# Patient Record
Sex: Female | Born: 1935 | Race: White | Hispanic: No | Smoking: Current every day smoker
Health system: Southern US, Community
[De-identification: ages and names within clinical notes are randomized; demographics above are authoritative.]

## PROBLEM LIST (undated history)

## (undated) DIAGNOSIS — R1310 Dysphagia, unspecified: Secondary | ICD-10-CM

## (undated) DIAGNOSIS — F0390 Unspecified dementia without behavioral disturbance: Secondary | ICD-10-CM

## (undated) DIAGNOSIS — F419 Anxiety disorder, unspecified: Secondary | ICD-10-CM

## (undated) DIAGNOSIS — T4145XA Adverse effect of unspecified anesthetic, initial encounter: Secondary | ICD-10-CM

## (undated) DIAGNOSIS — H9190 Unspecified hearing loss, unspecified ear: Secondary | ICD-10-CM

## (undated) DIAGNOSIS — F329 Major depressive disorder, single episode, unspecified: Secondary | ICD-10-CM

## (undated) DIAGNOSIS — K649 Unspecified hemorrhoids: Secondary | ICD-10-CM

## (undated) DIAGNOSIS — C519 Malignant neoplasm of vulva, unspecified: Secondary | ICD-10-CM

## (undated) DIAGNOSIS — T8859XA Other complications of anesthesia, initial encounter: Secondary | ICD-10-CM

## (undated) DIAGNOSIS — I10 Essential (primary) hypertension: Secondary | ICD-10-CM

## (undated) DIAGNOSIS — B059 Measles without complication: Secondary | ICD-10-CM

## (undated) DIAGNOSIS — N390 Urinary tract infection, site not specified: Secondary | ICD-10-CM

## (undated) DIAGNOSIS — D649 Anemia, unspecified: Secondary | ICD-10-CM

## (undated) DIAGNOSIS — R41841 Cognitive communication deficit: Secondary | ICD-10-CM

## (undated) DIAGNOSIS — G894 Chronic pain syndrome: Secondary | ICD-10-CM

## (undated) DIAGNOSIS — H269 Unspecified cataract: Secondary | ICD-10-CM

## (undated) DIAGNOSIS — H543 Unqualified visual loss, both eyes: Secondary | ICD-10-CM

## (undated) HISTORY — DX: Unspecified dementia, unspecified severity, without behavioral disturbance, psychotic disturbance, mood disturbance, and anxiety: F03.90

## (undated) HISTORY — PX: CATARACT EXTRACTION: SUR2

## (undated) HISTORY — DX: Urinary tract infection, site not specified: N39.0

## (undated) HISTORY — DX: Malignant neoplasm of vulva, unspecified: C51.9

## (undated) HISTORY — DX: Unspecified hearing loss, unspecified ear: H91.90

## (undated) HISTORY — PX: TUBAL LIGATION: SHX77

## (undated) HISTORY — PX: EYE SURGERY: SHX253

## (undated) HISTORY — PX: APPENDECTOMY: SHX54

## (undated) HISTORY — DX: Unqualified visual loss, both eyes: H54.3

## (undated) HISTORY — DX: Measles without complication: B05.9

---

## 2013-02-27 DIAGNOSIS — R030 Elevated blood-pressure reading, without diagnosis of hypertension: Secondary | ICD-10-CM

## 2014-02-03 ENCOUNTER — Encounter (HOSPITAL_COMMUNITY): Payer: Self-pay | Admitting: Emergency Medicine

## 2014-02-03 ENCOUNTER — Emergency Department (HOSPITAL_COMMUNITY)
Admission: EM | Admit: 2014-02-03 | Discharge: 2014-02-03 | Disposition: A | Discharge status: Home / Self Care | Payer: Medicare Other | Attending: Emergency Medicine | Admitting: Emergency Medicine

## 2014-02-03 DIAGNOSIS — N949 Unspecified condition associated with female genital organs and menstrual cycle: Secondary | ICD-10-CM | POA: Diagnosis present

## 2014-02-03 DIAGNOSIS — I10 Essential (primary) hypertension: Secondary | ICD-10-CM | POA: Insufficient documentation

## 2014-02-03 DIAGNOSIS — F17200 Nicotine dependence, unspecified, uncomplicated: Secondary | ICD-10-CM | POA: Insufficient documentation

## 2014-02-03 DIAGNOSIS — N390 Urinary tract infection, site not specified: Secondary | ICD-10-CM | POA: Diagnosis not present

## 2014-02-03 DIAGNOSIS — Z792 Long term (current) use of antibiotics: Secondary | ICD-10-CM | POA: Diagnosis not present

## 2014-02-03 DIAGNOSIS — N898 Other specified noninflammatory disorders of vagina: Secondary | ICD-10-CM | POA: Diagnosis not present

## 2014-02-03 HISTORY — DX: Essential (primary) hypertension: I10

## 2014-02-03 LAB — URINE MICROSCOPIC-ADD ON

## 2014-02-03 LAB — URINALYSIS, ROUTINE W REFLEX MICROSCOPIC
Bilirubin Urine: NEGATIVE
Glucose, UA: NEGATIVE mg/dL
Ketones, ur: NEGATIVE mg/dL
Nitrite: NEGATIVE
Protein, ur: NEGATIVE mg/dL
UROBILINOGEN UA: 0.2 mg/dL (ref 0.0–1.0)
pH: 6.5 (ref 5.0–8.0)

## 2014-02-03 MED ORDER — CIPROFLOXACIN HCL 500 MG PO TABS: 500.0000 mg | ORAL_TABLET | Freq: Two times a day (BID) | ORAL | Status: DC

## 2014-02-03 NOTE — ED Notes (Signed)
Family reports pt has had burning with urination for the past 6 months.  Reports has had several visits to her PCP for same.  Today pt reports also has bumps on or around her vagina and burning is more severe when she voids.

## 2014-02-03 NOTE — ED Provider Notes (Signed)
CSN: [id number]     Arrival date & time 02/03/14  1105 History  This chart was scribed for Nat Christen, MD by Ludger Nutting, ED Scribe. This patient was seen in room APA07/APA07 and the patient's care was started 12:11 PM.    No chief complaint on file.   The history is provided by the patient. No language interpreter was used.    HPI Comments: Erin Mckay is a 80 y.o. female who presents to the Emergency Department complaining of constant, gradually worsening vulvar pain that has been ongoing for the past 6 months. Patient describes the pain as a burning sensation and states it is worse with urination. She also reports associated bumps near the entrance of the vagina. Patient states she was seen by Dr. Melina Copa 3 months ago for the same symptoms but was not treated.  She has no other complaints at this time.   PCP Melina Copa in [place]   Past Medical History  Diagnosis Date  . Hypertension    History reviewed. No pertinent past surgical history. No family history on file. History  Substance Use Topics  . Smoking status: Current Every Day Smoker  . Smokeless tobacco: Not on file  . Alcohol Use: No   OB History   Grav Para Term Preterm Abortions TAB SAB Ect Mult Living                 Review of Systems  A complete 10 system review of systems was obtained and all systems are negative except as noted in the HPI and PMH.    Allergies  Streptomycin  Home Medications   Prior to Admission medications   Medication Sig Start Date End Date Taking? Authorizing Provider  lisinopril (PRINIVIL,ZESTRIL) 40 MG tablet Take 1 tablet by mouth every evening.  11/14/13  Yes Historical Provider, MD  ciprofloxacin (CIPRO) 500 MG tablet Take 1 tablet (500 mg total) by mouth 2 (two) times daily. One po bid x 7 days 02/03/14   Nat Christen, MD   BP 190/74  Pulse 80  Temp(Src) 97.9 F (36.6 C) (Oral)  Resp 18  SpO2 99% Physical Exam  Nursing note and vitals reviewed. Constitutional: She is  oriented to person, place, and time. She appears well-developed and well-nourished.  HENT:  Head: Normocephalic and atraumatic.  Eyes: Conjunctivae and EOM are normal. Pupils are equal, round, and reactive to light.  Neck: Normal range of motion. Neck supple.  Cardiovascular: Normal rate, regular rhythm and normal heart sounds.   Pulmonary/Chest: Effort normal and breath sounds normal.  Abdominal: Soft. Bowel sounds are normal.  Genitourinary:  1.5 cm diameter papular, erythematous area near the right introitus.   Musculoskeletal: Normal range of motion.  Neurological: She is alert and oriented to person, place, and time.  Skin: Skin is warm and dry.  Psychiatric: She has a normal mood and affect. Her behavior is normal.    ED Course  Procedures (including critical care time)  DIAGNOSTIC STUDIES: Oxygen Saturation is 97% on RA, adequate by my interpretation.    COORDINATION OF CARE: 12:26 PM Will have patient follow up with a gynecologist. Discussed treatment plan with pt at bedside and pt agreed to plan.   Labs Review Labs Reviewed  URINALYSIS, ROUTINE W REFLEX MICROSCOPIC - Abnormal; Notable for the following:    Specific Gravity, Urine <1.005 (*)    Hgb urine dipstick TRACE (*)    Leukocytes, UA LARGE (*)    All other components within normal limits  URINE CULTURE  URINE MICROSCOPIC-ADD ON    Imaging Review No results found.   EKG Interpretation None      MDM   Final diagnoses:  Vaginal lesion  UTI (lower urinary tract infection)    Physical exam reveals a lesion in the right introitus. Will refer patient to Dr. Mallory Shirk. Also Rx for urinary tract infection with Cipro 500 mg twice a day. Discussed with patient and her daughter  I personally performed the services described in this documentation, which was scribed in my presence. The recorded information has been reviewed and is accurate.   Nat Christen, MD 02/03/14 [phone]

## 2014-02-03 NOTE — Discharge Instructions (Signed)
I left a message for gynecologist on call, Dr. Mallory Shirk.   If you do not get a followup call by Monday afternoon, call his office.  Antibiotic for urinary infection.

## 2014-02-04 LAB — URINE CULTURE
COLONY COUNT: NO GROWTH
Culture: NO GROWTH
SPECIAL REQUESTS: NORMAL

## 2014-02-09 ENCOUNTER — Other Ambulatory Visit: Payer: Self-pay | Admitting: Obstetrics and Gynecology

## 2014-02-09 ENCOUNTER — Encounter: Payer: Self-pay | Admitting: Obstetrics and Gynecology

## 2014-02-09 ENCOUNTER — Ambulatory Visit (INDEPENDENT_AMBULATORY_CARE_PROVIDER_SITE_OTHER): Payer: Medicare Other | Admitting: Obstetrics and Gynecology

## 2014-02-09 VITALS — BP 162/78 | Ht 62.0 in | Wt 123.0 lb

## 2014-02-09 DIAGNOSIS — C519 Malignant neoplasm of vulva, unspecified: Secondary | ICD-10-CM | POA: Insufficient documentation

## 2014-02-09 HISTORY — DX: Malignant neoplasm of vulva, unspecified: C51.9

## 2014-02-09 NOTE — Addendum Note (Signed)
Addended by: Farley Ly on: 02/09/2014 04:28 PM   Modules accepted: Orders

## 2014-02-09 NOTE — Progress Notes (Signed)
Patient ID: Erin Mckay, female   DOB: 1923-09-07, 80 y.o.   MRN: [id number]  This chart was scribed by Ludger Nutting, Medical Scribe, for Dr. Mallory Shirk on 02/09/14 at 3:40 PM. This chart was reviewed by Dr. Mallory Shirk for accuracy.   [facility] Visit  Patient name: Erin Mckay MRN [id number]  Date of birth: March 12, 1924  CC & HPI:  Erin Mckay is a 80 y.o. female presenting today for follow up after an ED visit. Patient was seen on 6/19 for vulvar pain which has been constant for the past 6 months. She was diagnosed with a papular lesion to the right introitus and was referred here for follow up. Patient states she has applied Vaseline to the area without relief. She also reports associated itching and burning sensations which is worse with urination.   ROS:  +vulvar pain +vulvar itching   Pertinent History Reviewed:   Reviewed: Significant for  Medical                                    Surgical Hx:    Medications: Reviewed & Updated - see associated section Social History: Reviewed -  reports that she has been smoking Cigarettes.  She has a 24 pack-year smoking history. She has quit using smokeless tobacco. Her smokeless tobacco use included Chew.  Objective Findings:  Vitals: Blood pressure 162/78, height 5\' 2"  (1.575 m), weight 123 lb (55.792 kg).  Physical Examination: General appearance - alert, well appearing, and in no distress and oriented to person, place, and time Pelvic -  VULVA: no adenopathy, 2.5 cm raised, firm, circular mass on inner right labia majora at 9 o'clock VAGINA: not indicated  CERVIX: not indicated UTERUS: not indicated ADNEXA: not indicated  VULVAR BIOPSY NOTE The indications for vulvar biopsy (rule out neoplasia, establish lichen sclerosus diagnosis) were reviewed.   Risks of the biopsy including pain, bleeding, infection, inadequate specimen, scarring and need for additional procedures  were discussed. The patient stated  understanding and agreed to undergo procedure today. Consent was signed,  time out performed.  The patient's vulva was prepped with Betadine. 2% lidocaine with epi was injected into the inner right labia majora. A 5-mm punch biopsy was done, biopsy tissue was picked up with sterile forceps and sterile scissors were used to excise the lesion.  Small bleeding was noted and hemostasis was achieved using silver nitrate sticks.and one stitch of prolene  The patient tolerated the procedure well. Post-procedure instructions  (pelvic rest for one week) were given to the patient. The patient is to call with heavy bleeding, fever greater than 100.4, foul smelling vaginal discharge or other concerns. The patient will be return to clinic in two weeks for discussion of results.   Assessment & Plan:   A: 1. Vulvar cancer /vulvar mass  P: 1. Follow up for biopsy results and suture removal in 12-14 days

## 2014-02-20 ENCOUNTER — Telehealth: Payer: Self-pay | Admitting: *Deleted

## 2014-02-20 NOTE — Telephone Encounter (Signed)
Dr. Glo Herring discussed biopsy results with family .

## 2014-02-23 ENCOUNTER — Ambulatory Visit (INDEPENDENT_AMBULATORY_CARE_PROVIDER_SITE_OTHER): Payer: Medicare Other | Admitting: Obstetrics and Gynecology

## 2014-02-23 ENCOUNTER — Encounter: Payer: Self-pay | Admitting: Obstetrics and Gynecology

## 2014-02-23 VITALS — BP 180/100 | Wt 120.0 lb

## 2014-02-23 DIAGNOSIS — C519 Malignant neoplasm of vulva, unspecified: Secondary | ICD-10-CM

## 2014-02-23 NOTE — Patient Instructions (Addendum)
Follow up scheduled appointment with Gyn oncologist at [facility], at [address] , [place]  On July 13. Phone [phone] 11:30 am. Dr Denman George.

## 2014-02-23 NOTE — Progress Notes (Signed)
This chart was scribed by Ludger Nutting, Medical Scribe, for Dr. Mallory Shirk on 02/23/14 at 1:55 PM. This chart was reviewed by Dr. Mallory Shirk for accuracy.   [facility] Visit  Patient name: Erin Mckay MRN [id number]  Date of birth: 12-Apr-1924  CC & HPI:  Erin Mckay is a 80 y.o. female presenting today for a vulvar stitch removal. Patient was seen on 02/09/14 for a vulvar biopsy which required a stitch placement. She continues to have vulvar pain which is worse after urination. She describes the pain as a burning sensation. She has been applying Vaseline to the affected area even though she was advised to apply Desitin.   ROS:  Negative except for noted in the HPI  Pertinent History Reviewed:   Reviewed: Significant for  Medical                                    Surgical Hx:    Medications: Reviewed & Updated - see associated section Social History: Reviewed -  reports that she has been smoking Cigarettes.  She has a 24 pack-year smoking history. She has quit using smokeless tobacco. Her smokeless tobacco use included Chew.  Objective Findings:  Vitals: Blood pressure 180/100, weight 120 lb (54.432 kg).  Physical Examination: General appearance - alert, well appearing, and in no distress and oriented to person, place, and time Mental status - alert, oriented to person, place, and time, normal mood, behavior, speech, dress, motor activity, and thought processes Pelvic -  VULVA: Kissing lesion, redness bilaterally WHICH IS DRAMATICALLY ERYTHEMATOUS THAN LAST WEEK,  VAGINA: normal appearing vagina with normal color and discharge   Assessment & Plan:   A: 1. Stitch removal, Biopsy stitch healing adequately, stitch removed. 2. Kissing lesion, redness bilaterally WHICH IS DRAMATICALLY more ERYTHEMATOUS THAN LAST WEEK  P: 1. Discontinue use of Vaseline 2. Dry vulva regimen with blow dryer on cool after bath, tissue tucked in.     desitin prn voiding. 2. Follow up  scheduled appointment with [facility] oncologist at [facility], at [address] , [place], [place]

## 2014-02-27 ENCOUNTER — Encounter: Payer: Self-pay | Admitting: Gynecologic Oncology

## 2014-02-27 ENCOUNTER — Ambulatory Visit: Payer: Medicare Other | Attending: Gynecologic Oncology | Admitting: Gynecologic Oncology

## 2014-02-27 VITALS — BP 196/89 | HR 77 | Temp 97.8°F | Resp 16 | Ht 60.95 in | Wt 121.4 lb

## 2014-02-27 DIAGNOSIS — F17200 Nicotine dependence, unspecified, uncomplicated: Secondary | ICD-10-CM | POA: Insufficient documentation

## 2014-02-27 DIAGNOSIS — I10 Essential (primary) hypertension: Secondary | ICD-10-CM | POA: Insufficient documentation

## 2014-02-27 DIAGNOSIS — C519 Malignant neoplasm of vulva, unspecified: Secondary | ICD-10-CM | POA: Diagnosis not present

## 2014-02-27 DIAGNOSIS — L293 Anogenital pruritus, unspecified: Secondary | ICD-10-CM

## 2014-02-27 DIAGNOSIS — Z79899 Other long term (current) drug therapy: Secondary | ICD-10-CM | POA: Diagnosis not present

## 2014-02-27 DIAGNOSIS — N94819 Vulvodynia, unspecified: Secondary | ICD-10-CM

## 2014-02-27 MED ORDER — LIDOCAINE 5 % EX OINT: 1.0000 "application " | TOPICAL_OINTMENT | Freq: Two times a day (BID) | CUTANEOUS | Status: DC | PRN

## 2014-02-27 NOTE — Patient Instructions (Signed)
Vulvectomy, Care After The vulva is the external female genitalia, outside and around the vagina and pubic bone. It consists of:  The skin on, and in front of, the pubic bone.  The clitoris.  The labia majora (large lips) on the outside of the vagina.  The labia minora (small lips) around the opening of the vagina.  The opening and the skin in and around the vagina. A vulvectomy is the removal of the tissue of the vulva, which sometimes includes removal of the lymph nodes and tissue in the groin areas. These discharge instructions provide you with general information on caring for yourself after you leave the hospital. It is also important that you know the warning signs of complications, so that you can seek treatment. Please read the instructions outlined below and refer to this sheet in the next few weeks. Your caregiver may also give you specific information and medicines. If you have any questions or complications after discharge, please call your caregiver. ACTIVITY  Rest as much as possible the first two weeks after discharge.  Arrange to have help from family or others with your daily activities when you go home.  Avoid heavy lifting (more than 5 pounds), pushing, or pulling.  If you feel tired, balance your activity with rest periods.  Follow your caregiver's instruction about climbing stairs and driving a car.  Increase activity gradually.  Do not exercise until you have permission from your caregiver. LEG AND FOOT CARE If your doctor has removed lymph nodes from your groin area, there may be an increase in swelling of your legs and feet. You can help prevent swelling by doing the following:  Elevate your legs while sitting or lying down.  If your caregiver has ordered special stockings, wear them according to instructions.  Avoid standing in one place for long periods of time.  Call the physical therapy department if you have any questions about swelling or treatment  for swelling.  Avoid salt in your diet. It can cause fluid retention and swelling.  Do not cross your legs, especially when sitting. NUTRITION  You may resume your normal diet.  Drink 6 to 8 glasses of fluids a day.  Eat a healthy, balanced diet including portions of food from the meat (protein), milk, fruit, vegetable, and bread groups.  Your caregiver may recommend you take a multivitamin with iron. ELIMINATION  You may notice that your stream of urine is at a different angle, and may tend to spray. Using a plastic funnel may help to decrease urine spray.  If constipation occurs, drink more liquids, and add more fruits, vegetables, and bran to your diet. You may take a mild laxative, such as Milk of Magnesia, Metamucil or a stool softener, such as Colace with permission from your caregiver. HYGIENE  You may shower and wash your hair.  Check with your caregiver about tub baths.  Do not add any bath oils or chemicals to your bath water, after you have permission to take baths.  Clean yourself well after moving your bowels.  After urinating, wipe from top to bottom.  A sitz bath will help keep your perineal area clean, reduce swelling, and provide comfort. HOME CARE INSTRUCTIONS   Take your temperature twice a day and record it, especially if you feel feverish or have chills.  Follow your caregiver's instructions about medicines, activity, and follow-up appointments after surgery.  Do not drink alcohol while taking pain medicine.  Change your dressing as advised by your caregiver.  You may take over-the-counter medicine for pain, recommended by your caregiver.  If your pain is not relieved with medicine, call your caregiver.  Do not take aspirin, because it can cause bleeding.  Do not douche or use tampons (use a non-perfumed sanitary pad).  Do not have sexual intercourse until your caregiver gives you permission. Hugging, kissing, and playful sexual activity is  fine with your caregiver's permission.  Warm sitz baths, with your caregiver's permission, are helpful to control swelling and discomfort.  Take showers instead of baths, until your caregiver gives you permission to take baths.  You may take a mild medicine for constipation, recommended by your caregiver. Bran foods and drinking a lot of fluids will help with constipation.  Make sure your family understands everything about your operation and recovery. SEEK MEDICAL CARE IF:   You notice swelling and redness around the wound area.  You notice a foul smell coming from the wound or on the surgical dressing.  You notice the wound is separating.  You have painful or bloody urination.  You develop nausea and vomiting.  You develop diarrhea.  You develop a rash.  You have a reaction or allergy from the medicine.  You feel dizzy or light headed.  You need stronger pain medicine. SEEK IMMEDIATE MEDICAL CARE IF:   You develop a temperature of 102 F (38.9 C) or higher.  You pass out.  You develop leg or chest pain.  You develop abdominal pain.  You develop shortness of breath.  You develop bleeding from the wound area.  You see pus in the wound area. MAKE SURE YOU:   Understand these instructions.  Will watch your condition.  Will get help right away if you are not doing well or get worse. Document Released: 03/18/2004 Document Revised: 05/25/2013 Document Reviewed: 07/06/2009 [facility] Patient Information 2015 [place], [place]. This information is not intended to replace advice given to you by your health care provider. Make sure you discuss any questions you have with your health care provider.

## 2014-02-27 NOTE — Progress Notes (Signed)
Consult Note: Gyn-Onc  Consult was requested by Dr. Glo Herring for the evaluation of Erin Mckay 80 y.o. female for clinical stage IB vulvar cancer (SCC).  CC:  Chief Complaint  Patient presents with  . Vulvar Cancer  . Vaginal Itching    Assessment/Plan:  Ms. Erin Mckay  is a 80 y.o.  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 Ms. Erin Mckay main symptom is of vulvodynia including pruritus and discomfort. She is attended today by her daughters Velva Harman and Shauna Hugh. All 3 field very strongly that they do not want surgical intervention. There determined to receive palliative treatment only. She presents today with poorly controlled hypertension.  I discussed the ideal management of a clinical stage IB vulvar cancer would be a modified radical left vulvectomy with left inguinal lymphadenectomy versus sentinel lymph node biopsy. Preoperatively I would ascertain whether or not there was gross metastatic disease by performing a PET scan. However I agree that in this patient the likely morbidity of such a procedure would outweigh its benefits. An alternative to this radical procedure would be a palliative toileting procedure, which would include a wide local excision of the primary tumor with a goal to achieve minimal margins, but excised for symptomatic to my prior to it encroaching upon vital perineal structures. I discussed that this procedure, a wide local excision of the right vulva, can usually be achieved under either her spinal or local anesthesia, may be able to be performed as an outpatient or outpatient in bed procedure, it is associated with a low morbidity. I feel that this procedure carries the highest likelihood of controlling symptoms of pruritus and discomfort both in the short-term and long-term while sparing her a radical surgical excision. I discussed the only nonsurgical treatment for a vulvar  cancer which is primary radiation, and we talked about indications for primary radiation which predominantly include patients with an acceptable surgical risk, all patients whose vulva cancer encroaches upon vital perineal structures such as the urethra or anus. I discussed that primary radiation is associated with significant perineal burning, and symptoms. As such I do not incudis the most ideal procedure for this patient whose primary motive used to minimize morbidity in symptoms.  I discussed that prior to embarking upon any procedure even a minor wide local excision, Erin Mckay would require optimization of her blood pressure. Am recommending that she return to see her primary care doctor Dr. Octavio Graves. It may be that she requires increase dosing of her lisinopril or the addition of a second medication. I believe her blood pressure at present places her at at substantial risk for a a cardiac or cerebrovascular event perioperatively.  Erin Mckay and her daughters expressed ongoing concerns regarding any surgical intervention including a minor wide local excision. I emphasized that I felt that the window of the possibility for this procedure was narrow, as the lesion will continue to grow, in which case a relatively small excision will not be possible. I discussed anticipated postoperative recovery from a wide local excision and perineal care. Provided the patient with this information and for discharge from this visit.  To palliate her current symptoms of vulvar pruritis, I prescribed 4% topical lidocaine ointment to apply the right ear when necessary underneath thickly apply Desitin. I am recommending we application of Desitin and after each episode of toileting. Erin Mckay and her daughters will reach out to me in my office if they decide to proceed  with a wide local excision of the vulva cancer or have additional questions. I provided them with my contact information.   HPI: Erin Mckay is an 80 year old  woman who is seen in consultation at the request of Dr. Glo Herring for clinical stage IB squamous cell carcinoma of the right vulva  (moderately differentiated). Erin Mckay has a 5 year history of vulva itch. She reports feeling a bump in the area for approximately one month she was evaluated by Dr. Glo Herring on 02/09/2014 at which time he identified a right-sided 2.5 cm raised lesion on the mid labia minora at 9:00. He performed a 5 mm punch biopsy of that lesions and pathology returned moderately differentiated squamous cell carcinoma. Since that time she is reported increasing vulvar pruritus. She's been using Vaseline cream initially, but in the past 48 hours has switched to Desitin and since that time has noticed improvement in symptoms.   She is a very independent elderly woman who is determined to continue independent functioning and strongly desires nonsurgical management of her condition.  Interval History: Improvement in symptoms with Desitin use.  Current Meds:  Outpatient Encounter Prescriptions as of 02/27/2014  Medication Sig  . Aspirin-Salicylamide-Caffeine (BC HEADACHE POWDER PO) Take by mouth.  . ciprofloxacin (CIPRO) 500 MG tablet Take 1 tablet (500 mg total) by mouth 2 (two) times daily. One po bid x 7 days  . lidocaine (XYLOCAINE) 5 % ointment Apply 1 application topically 2 (two) times daily as needed.  Marland Kitchen lisinopril (PRINIVIL,ZESTRIL) 40 MG tablet Take 1 tablet by mouth every evening.     Allergy:  Allergies  Allergen Reactions  . Streptomycin     Any mycin drugs    Social Hx:   History   Social History  . Marital Status: Widowed    Spouse Name: N/A    Number of Children: N/A  . Years of Education: N/A   Occupational History  . Not on file.   Social History Main Topics  . Smoking status: Current Every Day Smoker -- 0.50 packs/day for 48 years    Types: Cigarettes  . Smokeless tobacco: Former Systems developer    Types: Chew  . Alcohol Use: No  . Drug Use: No  . Sexual  Activity: Not Currently    Birth Control/ Protection: Post-menopausal   Other Topics Concern  . Not on file   Social History Narrative  . No narrative on file    Past Surgical Hx:  Past Surgical History  Procedure Laterality Date  . Tubal ligation    . Appendectomy    . Eye surgery Left     cataract removal and was blinded.    Past Medical Hx:  Past Medical History  Diagnosis Date  . Hypertension   . Frequent UTI   . Hard of hearing   . Vision loss, bilateral   . Measles   . Vulvar cancer 02/09/14    Past Gynecological History:  5 vaginal deliveries. No history of vulvar dysplasia, no history of HPV disease, no history of urinary incontinence. No history of lichen sclerosis. No LMP recorded. Patient is postmenopausal.  Family Hx:  Family History  Problem Relation Age of Onset  . Heart disease Mother   . Early death Father   . Anuerysm Sister     stomach  . Cancer Brother   . Hypertension Daughter   . Hyperlipidemia Daughter   . Cancer Son     bladder  . Heart disease Brother   . Hypertension Son   .  Hypertension Son   . Hypertension Daughter     Review of Systems:  Constitutional  Feels well,  with the exception of history present complaint.  ENT Normal appearing ears and nares bilaterally Skin/Breast  No rash, sores, jaundice, itching, dryness Cardiovascular  No chest pain, shortness of breath, or edema  Pulmonary  No cough or wheeze.  Gastro Intestinal  No nausea, vomitting, or diarrhoea. No bright red blood per rectum, no abdominal pain, change in bowel movement, or constipation.  Genito Urinary  No frequency, urgency, dysuria, see history of present illness Musculo Skeletal  No myalgia, arthralgia, joint swelling or pain  Neurologic  No weakness, numbness, change in gait,  Psychology  No depression, anxiety, insomnia.   Vitals:  Blood pressure 196/89, pulse 77, temperature 97.8 F (36.6 C), temperature source Oral, resp. rate 16, height 5'  0.95" (1.548 m), weight 121 lb 6.4 oz (55.067 kg).  Physical Exam: WD in NAD Neck  Supple NROM, without any enlargements.  Lymph Node Survey No cervical supraclavicular or inguinal adenopathy. Cardiovascular  Pulse normal rate, regularity and rhythm. S1 and S2 normal.  Lungs  Clear to auscultation bilateraly, without wheezes/crackles/rhonchi. Good air movement.  Skin  No rash/lesions/breakdown  Psychiatry  Alert and oriented to person, place, and time  Abdomen  Normoactive bowel sounds, abdomen soft, non-tender and non-obese. Presence of a left inguinal reducable soft hernia.   Back No CVA tenderness Genito Urinary  Vulva/vagina:  A 2.5 cm, raised, erythematous, nodular lesion, which is discoid in shape, is identified in the mid right labia minora. This 2 cm from the urethral meatus and midline anteriorly. It is greater than 5 cm from the midline perineal structures posteriorly and anus. Is tender to palpation. The surrounding skin of the labia majora and minora it is bright red and irritated and somewhat desquamated.   Bladder/urethra:  No lesions or masses, well supported bladder  Vagina: Deferred  Cervix: deferred exam  Uterus: deferred exam  Adnexa: deferred exam  Rectal : Deferred exam Extremities  No bilateral cyanosis, clubbing or edema.   @[facility] @ 02/27/2014, 12:39 PM

## 2014-03-02 ENCOUNTER — Telehealth: Payer: Self-pay | Admitting: Obstetrics and Gynecology

## 2014-03-02 NOTE — Telephone Encounter (Signed)
I spoke with the patient and her daughter , and encouraged them to strongly consider wide local excision as recommended by Dr Denman George.  Family to discuss and call dr Denman George with decision

## 2014-03-02 NOTE — Telephone Encounter (Signed)
Message copied by Jonnie Kind on Thu Mar 02, 2014  7:02 PM ------      Message from: Everitt Amber C      Created: Mon Feb 27, 2014 12:42 PM       Dr Glo Herring,      Thank you for this consultation of Ms Stansel. She is motivated for a minimal (or no) procedure. She is only interested in symptom palliation. I believe the best option for her her is wide local excision/partial simple vulvectomy under spinal or local and recommend doing this quickly while the lesion is small. She would require optimization of her BP with Dr Melina Copa prior to this. The patient and her daughters will further consider. I prescribed topical lidocaine ointment to help in the short term with pruritis (in addition to Desitin). ------

## 2014-03-07 ENCOUNTER — Telehealth: Payer: Self-pay | Admitting: *Deleted

## 2014-03-07 NOTE — Telephone Encounter (Signed)
Pt's daughter called to change surgery date. Date changed to Aug 11, [facility] OR. Case reposted

## 2014-03-15 ENCOUNTER — Telehealth: Payer: Self-pay | Admitting: *Deleted

## 2014-03-15 NOTE — Telephone Encounter (Signed)
Pt's daughter called states "Erin Mckay needs to see Dr. Denman George before her surgery on 8/11, she's having burning and itching from that place that they did a biopsy on in [place], it never really healed. We've been putting the cream on the area and keeping it dry but it is not helping."  Discussed with daughter I wil review with Dr. Denman George her concerns and call back with further instructions.

## 2014-03-17 ENCOUNTER — Encounter (HOSPITAL_COMMUNITY): Payer: Self-pay | Admitting: Pharmacy Technician

## 2014-03-17 ENCOUNTER — Ambulatory Visit (INDEPENDENT_AMBULATORY_CARE_PROVIDER_SITE_OTHER): Payer: Medicare Other | Admitting: Obstetrics and Gynecology

## 2014-03-17 ENCOUNTER — Encounter: Payer: Self-pay | Admitting: Obstetrics and Gynecology

## 2014-03-17 VITALS — BP 160/80 | Ht 62.0 in | Wt 121.0 lb

## 2014-03-17 DIAGNOSIS — R300 Dysuria: Secondary | ICD-10-CM

## 2014-03-17 LAB — POCT URINALYSIS DIPSTICK
GLUCOSE UA: NEGATIVE
Ketones, UA: NEGATIVE
NITRITE UA: NEGATIVE
PROTEIN UA: NEGATIVE

## 2014-03-17 NOTE — Telephone Encounter (Signed)
Pt followed up with GYN for symptoms

## 2014-03-17 NOTE — Progress Notes (Signed)
Patient ID: Erin Mckay, female   DOB: June 19, 1924, 80 y.o.   MRN: [id number]   [facility] Visit  Patient name: Erin Mckay MRN [id number]  Date of birth: 10-26-1923  CC & HPI:  [name] is a 80 y.o. female presenting today for r/o uti   ROS:  Pt scheduled for preop visit next week for partial vulvectomy on 11 August  Pertinent History Reviewed:   Reviewed: Significant for chronic irritation in vulvar area Medical         Past Medical History  Diagnosis Date  . Hypertension   . Frequent UTI   . Hard of hearing   . Vision loss, bilateral   . Measles   . Vulvar cancer 02/09/14                              Surgical Hx:    Past Surgical History  Procedure Laterality Date  . Tubal ligation    . Appendectomy    . Eye surgery Left     cataract removal and was blinded.   Medications: Reviewed & Updated - see associated section                      Current outpatient prescriptions:Aspirin-Salicylamide-Caffeine (BC HEADACHE POWDER PO), Take 1 application by mouth every 6 (six) hours as needed (pain). , Disp: , Rfl: ;  lisinopril (PRINIVIL,ZESTRIL) 40 MG tablet, Take 40 mg by mouth at bedtime. , Disp: , Rfl: ;  amLODipine (NORVASC) 5 MG tablet, Take 5 mg by mouth every evening., Disp: , Rfl:    Social History: Reviewed -  reports that she has been smoking Cigarettes.  She has a 24 pack-year smoking history. She has quit using smokeless tobacco. Her smokeless tobacco use included Chew.  Objective Findings:  Vitals: Blood pressure 160/80, height 5\' 2"  (1.575 m), weight 121 lb (54.885 kg).  Physical Examination: General appearance - alert, well appearing, and in no distress and appropriate for age Abdomen - soft, nontender, nondistended, no masses or organomegaly Pelvic - VULVA: irritated, erythematous.  IN and out cath done.  Assessment & Plan:   A: vulvar irritation, chronic      Doubt uti 1. F/u catheterization u/a , C&S  P:  1. Continue desitin,

## 2014-03-17 NOTE — Patient Instructions (Addendum)
Continue desitin as needed Expect a call on the urine culture.  You may call next Wednesday for results.

## 2014-03-19 LAB — URINE CULTURE
Colony Count: NO GROWTH
ORGANISM ID, BACTERIA: NO GROWTH

## 2014-03-20 ENCOUNTER — Ambulatory Visit (HOSPITAL_BASED_OUTPATIENT_CLINIC_OR_DEPARTMENT_OTHER): Admission: RE | Admit: 2014-03-20 | Payer: Medicare Other | Source: Ambulatory Visit | Admitting: Gynecologic Oncology

## 2014-03-20 ENCOUNTER — Encounter (HOSPITAL_BASED_OUTPATIENT_CLINIC_OR_DEPARTMENT_OTHER): Admission: RE | Payer: Self-pay | Source: Ambulatory Visit

## 2014-03-20 ENCOUNTER — Encounter (HOSPITAL_COMMUNITY)
Admission: RE | Admit: 2014-03-20 | Discharge: 2014-03-20 | Disposition: A | Discharge status: Home / Self Care | Payer: Medicare Other | Source: Ambulatory Visit | Attending: Gynecologic Oncology | Admitting: Gynecologic Oncology

## 2014-03-20 ENCOUNTER — Encounter (HOSPITAL_COMMUNITY): Payer: Self-pay

## 2014-03-20 ENCOUNTER — Ambulatory Visit (HOSPITAL_COMMUNITY)
Admission: RE | Admit: 2014-03-20 | Discharge: 2014-03-20 | Disposition: A | Discharge status: Home / Self Care | Payer: Medicare Other | Source: Ambulatory Visit | Attending: Gynecologic Oncology | Admitting: Gynecologic Oncology

## 2014-03-20 DIAGNOSIS — C519 Malignant neoplasm of vulva, unspecified: Secondary | ICD-10-CM | POA: Insufficient documentation

## 2014-03-20 DIAGNOSIS — Z01818 Encounter for other preprocedural examination: Secondary | ICD-10-CM | POA: Diagnosis present

## 2014-03-20 HISTORY — DX: Unspecified cataract: H26.9

## 2014-03-20 HISTORY — DX: Unspecified hemorrhoids: K64.9

## 2014-03-20 LAB — CBC
HEMATOCRIT: 40.8 % (ref 36.0–46.0)
HEMOGLOBIN: 12.9 g/dL (ref 12.0–15.0)
MCH: 28.7 pg (ref 26.0–34.0)
MCHC: 31.6 g/dL (ref 30.0–36.0)
MCV: 90.7 fL (ref 78.0–100.0)
Platelets: 274 10*3/uL (ref 150–400)
RBC: 4.5 MIL/uL (ref 3.87–5.11)
RDW: 13.2 % (ref 11.5–15.5)
WBC: 7.9 10*3/uL (ref 4.0–10.5)

## 2014-03-20 LAB — BASIC METABOLIC PANEL
Anion gap: 9 (ref 5–15)
BUN: 14 mg/dL (ref 6–23)
CO2: 28 meq/L (ref 19–32)
CREATININE: 0.88 mg/dL (ref 0.50–1.10)
Calcium: 9.9 mg/dL (ref 8.4–10.5)
Chloride: 104 mEq/L (ref 96–112)
GFR calc Af Amer: 66 mL/min — ABNORMAL LOW (ref >90)
GFR calc non Af Amer: 57 mL/min — ABNORMAL LOW (ref >90)
GLUCOSE: 134 mg/dL — AB (ref 70–99)
Potassium: 4.6 mEq/L (ref 3.7–5.3)
Sodium: 141 mEq/L (ref 137–147)

## 2014-03-20 SURGERY — EXAM UNDER ANESTHESIA
Anesthesia: Choice | Laterality: Right

## 2014-03-20 NOTE — Patient Instructions (Addendum)
[place]  03/20/2014   Your procedure is scheduled on:   03-28-2014 Tuesday at 0900 AM  Enter through [facility] Entrance and follow signs to [facility]. Arrive at     0700   AM.  Call this number if you have problems the morning of surgery: [phone]  Or Presurgical Testing [phone](Abbegayle Denault) For Living Will and/or Health Care Power Attorney Forms: please provide copy for your medical record,may bring AM of surgery(Forms should be already notarized -we do not provide this service).(03-20-14 No information preferred today).  Remember: Follow any bowel prep instructions per MD office.(Clear liquids x 24 hours before surgery).    Do not eat food:After Midnight.      Take these medicines the morning of surgery with A SIP OF WATER: none   Do not wear jewelry, make-up or nail polish.  Do not wear lotions, powders, or perfumes. You may wear deodorant.  Do not shave 48 hours(2 days) prior to first CHG shower(legs and under arms).(Shaving face and neck okay.)  Do not bring valuables to the hospital.(Hospital is not responsible for lost valuables).  Contacts, dentures or removable bridgework, body piercing, hair pins may not be worn into surgery.  Leave suitcase in the car. After surgery it may be brought to your room.  For patients admitted to the hospital, checkout time is 11:00 AM the day of discharge.(Restricted visitors-Any Persons displaying flu-like symptoms or illness).    Patients discharged the day of surgery will not be allowed to drive home. Must have responsible person with you x 24 hours once discharged.  Name and phone number of your driver: Enid Skeens , daughter [phone]  Special Instructions: CHG(Chlorhedine 4%-"Hibiclens","Betasept","Aplicare") Shower Use Special Wash: see special instructions.(avoid face and genitals)   Please read over the following fact sheets that you were given: Incentive Spirometry Instruction.      Patient  signature_______________________________________________________    [facility] - Preparing for Surgery Before surgery, you can play an important role.  Because skin is not sterile, your skin needs to be as free of germs as possible.  You can reduce the number of germs on your skin by washing with CHG (chlorahexidine gluconate) soap before surgery.  CHG is an antiseptic cleaner which kills germs and bonds with the skin to continue killing germs even after washing. Please DO NOT use if you have an allergy to CHG or antibacterial soaps.  If your skin becomes reddened/irritated stop using the CHG and inform your nurse when you arrive at Short Stay. Do not shave (including legs and underarms) for at least 48 hours prior to the first CHG shower.  You may shave your face/neck. Please follow these instructions carefully:  1.  Shower with CHG Soap the night before surgery and the  morning of Surgery.  2.  If you choose to wash your hair, wash your hair first as usual with your  normal  shampoo.  3.  After you shampoo, rinse your hair and body thoroughly to remove the  shampoo.                           4.  Use CHG as you would any other liquid soap.  You can apply chg directly  to the skin and wash                       Gently with a scrungie or clean washcloth.  5.  Apply the CHG Soap to your body ONLY FROM THE NECK DOWN.   Do not use on face/ open                           Wound or open sores. Avoid contact with eyes, ears mouth and genitals (private parts).                       Wash face,  Genitals (private parts) with your normal soap.             6.  Wash thoroughly, paying special attention to the area where your surgery  will be performed.  7.  Thoroughly rinse your body with warm water from the neck down.  8.  DO NOT shower/wash with your normal soap after using and rinsing off  the CHG Soap.                9.  Pat yourself dry with a clean towel.            10.  Wear clean pajamas.            11.   Place clean sheets on your bed the night of your first shower and do not  sleep with pets. Day of Surgery : Do not apply any lotions/deodorants the morning of surgery.  Please wear clean clothes to the hospital/surgery center.  FAILURE TO FOLLOW THESE INSTRUCTIONS MAY RESULT IN THE CANCELLATION OF YOUR SURGERY PATIENT SIGNATURE_________________________________  NURSE SIGNATURE__________________________________  ________________________________________________________________________   Adam Phenix  An incentive spirometer is a tool that can help keep your lungs clear and active. This tool measures how well you are filling your lungs with each breath. Taking long deep breaths may help reverse or decrease the chance of developing breathing (pulmonary) problems (especially infection) following:  A long period of time when you are unable to move or be active. BEFORE THE PROCEDURE   If the spirometer includes an indicator to show your best effort, your nurse or respiratory therapist will set it to a desired goal.  If possible, sit up straight or lean slightly forward. Try not to slouch.  Hold the incentive spirometer in an upright position. INSTRUCTIONS FOR USE  1. Sit on the edge of your bed if possible, or sit up as far as you can in bed or on a chair. 2. Hold the incentive spirometer in an upright position. 3. Breathe out normally. 4. Place the mouthpiece in your mouth and seal your lips tightly around it. 5. Breathe in slowly and as deeply as possible, raising the piston or the ball toward the top of the column. 6. Hold your breath for 3-5 seconds or for as long as possible. Allow the piston or ball to fall to the bottom of the column. 7. Remove the mouthpiece from your mouth and breathe out normally. 8. Rest for a few seconds and repeat Steps 1 through 7 at least 10 times every 1-2 hours when you are awake. Take your time and take a few normal breaths between deep  breaths. 9. The spirometer may include an indicator to show your best effort. Use the indicator as a goal to work toward during each repetition. 10. After each set of 10 deep breaths, practice coughing to be sure your lungs are clear. If you have an incision (the cut made at the time of surgery), support your incision when coughing by placing  a pillow or rolled up towels firmly against it. Once you are able to get out of bed, walk around indoors and cough well. You may stop using the incentive spirometer when instructed by your caregiver.  RISKS AND COMPLICATIONS  Take your time so you do not get dizzy or light-headed.  If you are in pain, you may need to take or ask for pain medication before doing incentive spirometry. It is harder to take a deep breath if you are having pain. AFTER USE  Rest and breathe slowly and easily.  It can be helpful to keep track of a log of your progress. Your caregiver can provide you with a simple table to help with this. If you are using the spirometer at home, follow these instructions: [place] IF:   You are having difficultly using the spirometer.  You have trouble using the spirometer as often as instructed.  Your pain medication is not giving enough relief while using the spirometer.  You develop fever of 100.5 F (38.1 C) or higher. SEEK IMMEDIATE MEDICAL CARE IF:   You cough up bloody sputum that had not been present before.  You develop fever of 102 F (38.9 C) or greater.  You develop worsening pain at or near the incision site. MAKE SURE YOU:   Understand these instructions.  Will watch your condition.  Will get help right away if you are not doing well or get worse. Document Released: 12/15/2006 Document Revised: 10/27/2011 Document Reviewed: 02/15/2007 ExitCare Patient Information 2014 [employer].   ________________________________________________________________________    CLEAR LIQUID DIET   Foods Allowed                                                                      Foods Excluded  Coffee and tea, regular and decaf                             liquids that you cannot  Plain Jell-O in any flavor                                             see through such as: Fruit ices (not with fruit pulp)                                     milk, soups, orange juice  Iced Popsicles                                    All solid food Carbonated beverages, regular and diet                                    Cranberry, grape and apple juices Sports drinks like Gatorade Lightly seasoned clear broth or consume(fat free) Sugar, honey syrup  Sample Menu Breakfast  Lunch                                     Supper Cranberry juice                    Beef broth                            Chicken broth Jell-O                                     Grape juice                           Apple juice Coffee or tea                        Jell-O                                      Popsicle                                                Coffee or tea                        Coffee or tea  _____________________________________________________________________

## 2014-03-20 NOTE — Pre-Procedure Instructions (Signed)
03-20-14 EKG, CXR done today.

## 2014-03-28 ENCOUNTER — Encounter (HOSPITAL_COMMUNITY): Payer: Medicare Other | Admitting: Registered Nurse

## 2014-03-28 ENCOUNTER — Ambulatory Visit (HOSPITAL_COMMUNITY)
Admission: RE | Admit: 2014-03-28 | Discharge: 2014-03-28 | Disposition: A | Discharge status: Home / Self Care | Payer: Medicare Other | Source: Ambulatory Visit | Attending: Gynecologic Oncology | Admitting: Gynecologic Oncology

## 2014-03-28 ENCOUNTER — Encounter (HOSPITAL_COMMUNITY): Payer: Self-pay | Admitting: *Deleted

## 2014-03-28 ENCOUNTER — Encounter (HOSPITAL_COMMUNITY)
Admission: RE | Disposition: A | Discharge status: Home / Self Care | Payer: Self-pay | Source: Ambulatory Visit | Attending: Gynecologic Oncology

## 2014-03-28 ENCOUNTER — Inpatient Hospital Stay (HOSPITAL_COMMUNITY): Payer: Medicare Other | Admitting: Registered Nurse

## 2014-03-28 DIAGNOSIS — C519 Malignant neoplasm of vulva, unspecified: Secondary | ICD-10-CM | POA: Diagnosis present

## 2014-03-28 DIAGNOSIS — Z7982 Long term (current) use of aspirin: Secondary | ICD-10-CM | POA: Insufficient documentation

## 2014-03-28 DIAGNOSIS — Z79899 Other long term (current) drug therapy: Secondary | ICD-10-CM | POA: Diagnosis not present

## 2014-03-28 DIAGNOSIS — F17200 Nicotine dependence, unspecified, uncomplicated: Secondary | ICD-10-CM | POA: Diagnosis not present

## 2014-03-28 DIAGNOSIS — Z881 Allergy status to other antibiotic agents status: Secondary | ICD-10-CM | POA: Insufficient documentation

## 2014-03-28 HISTORY — PX: VULVECTOMY: SHX1086

## 2014-03-28 SURGERY — WIDE EXCISION VULVECTOMY
Anesthesia: Spinal | Laterality: Right

## 2014-03-28 MED ORDER — ONDANSETRON HCL 4 MG/2ML IJ SOLN
INTRAMUSCULAR | Status: AC
  Filled 2014-03-28: qty 2

## 2014-03-28 MED ORDER — DOCUSATE SODIUM 100 MG PO CAPS: 100.0000 mg | ORAL_CAPSULE | Freq: Two times a day (BID) | ORAL | Status: DC

## 2014-03-28 MED ORDER — PROPOFOL 10 MG/ML IV BOLUS
INTRAVENOUS | Status: AC
  Filled 2014-03-28: qty 20

## 2014-03-28 MED ORDER — OXYCODONE-ACETAMINOPHEN 5-325 MG PO TABS
1.0000 | ORAL_TABLET | ORAL | Status: DC | PRN
Start: 2014-03-28 — End: 2014-05-23

## 2014-03-28 MED ORDER — EPHEDRINE SULFATE 50 MG/ML IJ SOLN
INTRAMUSCULAR | Status: DC | PRN
  Administered 2014-03-28: 5 mg via INTRAVENOUS

## 2014-03-28 MED ORDER — PROMETHAZINE HCL 25 MG/ML IJ SOLN: 6.2500 mg | INTRAMUSCULAR | Status: DC | PRN

## 2014-03-28 MED ORDER — LACTATED RINGERS IV SOLN
INTRAVENOUS | Status: DC
  Administered 2014-03-28: 1000 mL via INTRAVENOUS

## 2014-03-28 MED ORDER — FENTANYL CITRATE 0.05 MG/ML IJ SOLN
INTRAMUSCULAR | Status: DC | PRN
  Administered 2014-03-28: 50 ug via INTRAVENOUS

## 2014-03-28 MED ORDER — FENTANYL CITRATE 0.05 MG/ML IJ SOLN
INTRAMUSCULAR | Status: AC
  Filled 2014-03-28: qty 2

## 2014-03-28 MED ORDER — BUPIVACAINE IN DEXTROSE 0.75-8.25 % IT SOLN
INTRATHECAL | Status: DC | PRN
  Administered 2014-03-28: 12 mg via INTRATHECAL

## 2014-03-28 MED ORDER — LIDOCAINE HCL (CARDIAC) 20 MG/ML IV SOLN
INTRAVENOUS | Status: DC | PRN
  Administered 2014-03-28: 100 mg via INTRAVENOUS

## 2014-03-28 MED ORDER — ONDANSETRON HCL 4 MG/2ML IJ SOLN
INTRAMUSCULAR | Status: DC | PRN
  Administered 2014-03-28: 4 mg via INTRAVENOUS

## 2014-03-28 MED ORDER — LIDOCAINE HCL (CARDIAC) 20 MG/ML IV SOLN
INTRAVENOUS | Status: AC
Start: 2014-03-28 — End: 2014-03-28
  Filled 2014-03-28: qty 5

## 2014-03-28 MED ORDER — PROPOFOL INFUSION 10 MG/ML OPTIME
INTRAVENOUS | Status: DC | PRN
  Administered 2014-03-28: 50 ug/kg/min via INTRAVENOUS

## 2014-03-28 MED ORDER — LIDOCAINE HCL 1 % IJ SOLN
INTRAMUSCULAR | Status: DC | PRN
  Administered 2014-03-28: 10 mL

## 2014-03-28 MED ORDER — LIDOCAINE HCL 1 % IJ SOLN
INTRAMUSCULAR | Status: AC
  Filled 2014-03-28: qty 20

## 2014-03-28 MED ORDER — HYDROMORPHONE HCL PF 1 MG/ML IJ SOLN: 0.2500 mg | INTRAMUSCULAR | Status: DC | PRN

## 2014-03-28 MED ORDER — 0.9 % SODIUM CHLORIDE (POUR BTL) OPTIME
TOPICAL | Status: DC | PRN
  Administered 2014-03-28: 1000 mL

## 2014-03-28 SURGICAL SUPPLY — 35 items
BLADE SURG 15 STRL LF DISP TIS (BLADE) ×1 IMPLANT
BLADE SURG 15 STRL SS (BLADE) ×2
CANISTER SUCTION 2500CC (MISCELLANEOUS) IMPLANT
COVER SURGICAL LIGHT HANDLE (MISCELLANEOUS) ×2 IMPLANT
DECANTER SPIKE VIAL GLASS SM (MISCELLANEOUS) ×2 IMPLANT
DRAPE LG THREE QUARTER DISP (DRAPES) ×2 IMPLANT
DRAPE SURG IRRIG POUCH 19X23 (DRAPES) IMPLANT
GAUZE SPONGE 4X4 12PLY STRL (GAUZE/BANDAGES/DRESSINGS) IMPLANT
GAUZE SPONGE 4X4 16PLY XRAY LF (GAUZE/BANDAGES/DRESSINGS) ×2 IMPLANT
GLOVE BIO SURGEON STRL SZ 6.5 (GLOVE) ×2 IMPLANT
GLOVE BIO SURGEON STRL SZ7.5 (GLOVE) IMPLANT
GLOVE BIOGEL PI IND STRL 7.0 (GLOVE) ×1 IMPLANT
GLOVE BIOGEL PI INDICATOR 7.0 (GLOVE) ×1
MANIFOLD NEPTUNE II (INSTRUMENTS) ×2 IMPLANT
NEEDLE HYPO 22GX1.5 SAFETY (NEEDLE) IMPLANT
NS IRRIG 1000ML POUR BTL (IV SOLUTION) ×2 IMPLANT
PACK COOL COMFORT PERI COLD (SET/KITS/TRAYS/PACK) ×8 IMPLANT
PACK MINOR VAGINAL W LONG (CUSTOM PROCEDURE TRAY) ×2 IMPLANT
PENCIL BUTTON HOLSTER BLD 10FT (ELECTRODE) ×2 IMPLANT
SUT VIC AB 2-0 CT2 27 (SUTURE) IMPLANT
SUT VIC AB 2-0 SH 27 (SUTURE) ×2
SUT VIC AB 2-0 SH 27X BRD (SUTURE) ×1 IMPLANT
SUT VIC AB 3-0 PS2 18 (SUTURE)
SUT VIC AB 3-0 PS2 18XBRD (SUTURE) IMPLANT
SUT VIC AB 3-0 SH 27 (SUTURE) ×4
SUT VIC AB 3-0 SH 27X BRD (SUTURE) ×2 IMPLANT
SUT VIC AB 4-0 SH 27 (SUTURE) ×4
SUT VIC AB 4-0 SH 27XBRD (SUTURE) ×2 IMPLANT
SUT VICRYL 2 0 18  UND BR (SUTURE)
SUT VICRYL 2 0 18 UND BR (SUTURE) IMPLANT
SYR CONTROL 10ML LL (SYRINGE) ×2 IMPLANT
TOWEL OR 17X26 10 PK STRL BLUE (TOWEL DISPOSABLE) ×2 IMPLANT
TRAY FOLEY CATH 14FRSI W/METER (CATHETERS) ×2 IMPLANT
WATER STERILE IRR 1500ML POUR (IV SOLUTION) IMPLANT
YANKAUER SUCT BULB TIP 10FT TU (MISCELLANEOUS) ×2 IMPLANT

## 2014-03-28 NOTE — H&P (View-Only) (Signed)
Consult Note: Gyn-Onc  Consult was requested by Dr. Glo Herring for the evaluation of Erin Mckay 80 y.o. female for clinical stage IB vulvar cancer (SCC).  CC:  Chief Complaint  Patient presents with  . Vulvar Cancer  . Vaginal Itching    Assessment/Plan:  Erin Mckay  is a 80 y.o.  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 Erin Mckay main symptom is of vulvodynia including pruritus and discomfort. She is attended today by her daughters Velva Harman and Shauna Hugh. All 3 field very strongly that they do not want surgical intervention. There determined to receive palliative treatment only. She presents today with poorly controlled hypertension.  I discussed the ideal management of a clinical stage IB vulvar cancer would be a modified radical left vulvectomy with left inguinal lymphadenectomy versus sentinel lymph node biopsy. Preoperatively I would ascertain whether or not there was gross metastatic disease by performing a PET scan. However I agree that in this patient the likely morbidity of such a procedure would outweigh its benefits. An alternative to this radical procedure would be a palliative toileting procedure, which would include a wide local excision of the primary tumor with a goal to achieve minimal margins, but excised for symptomatic to my prior to it encroaching upon vital perineal structures. I discussed that this procedure, a wide local excision of the right vulva, can usually be achieved under either her spinal or local anesthesia, may be able to be performed as an outpatient or outpatient in bed procedure, it is associated with a low morbidity. I feel that this procedure carries the highest likelihood of controlling symptoms of pruritus and discomfort both in the short-term and long-term while sparing her a radical surgical excision. I discussed the only nonsurgical treatment for a vulvar  cancer which is primary radiation, and we talked about indications for primary radiation which predominantly include patients with an acceptable surgical risk, all patients whose vulva cancer encroaches upon vital perineal structures such as the urethra or anus. I discussed that primary radiation is associated with significant perineal burning, and symptoms. As such I do not incudis the most ideal procedure for this patient whose primary motive used to minimize morbidity in symptoms.  I discussed that prior to embarking upon any procedure even a minor wide local excision, Erin Mckay would require optimization of her blood pressure. Am recommending that she return to see her primary care doctor Dr. Octavio Graves. It may be that she requires increase dosing of her lisinopril or the addition of a second medication. I believe her blood pressure at present places her at at substantial risk for a a cardiac or cerebrovascular event perioperatively.  Erin Mckay and her daughters expressed ongoing concerns regarding any surgical intervention including a minor wide local excision. I emphasized that I felt that the window of the possibility for this procedure was narrow, as the lesion will continue to grow, in which case a relatively small excision will not be possible. I discussed anticipated postoperative recovery from a wide local excision and perineal care. Provided the patient with this information and for discharge from this visit.  To palliate her current symptoms of vulvar pruritis, I prescribed 4% topical lidocaine ointment to apply the right ear when necessary underneath thickly apply Desitin. I am recommending we application of Desitin and after each episode of toileting. Erin Mckay and her daughters will reach out to me in my office if they decide to proceed  with a wide local excision of the vulva cancer or have additional questions. I provided them with my contact information.   HPI: Erin Mckay is an 80 year old  woman who is seen in consultation at the request of Dr. Glo Herring for clinical stage IB squamous cell carcinoma of the right vulva  (moderately differentiated). Erin Mckay has a 5 year history of vulva itch. She reports feeling a bump in the area for approximately one month she was evaluated by Dr. Glo Herring on 02/09/2014 at which time he identified a right-sided 2.5 cm raised lesion on the mid labia minora at 9:00. He performed a 5 mm punch biopsy of that lesions and pathology returned moderately differentiated squamous cell carcinoma. Since that time she is reported increasing vulvar pruritus. She's been using Vaseline cream initially, but in the past 48 hours has switched to Desitin and since that time has noticed improvement in symptoms.   She is a very independent elderly woman who is determined to continue independent functioning and strongly desires nonsurgical management of her condition.  Interval History: Improvement in symptoms with Desitin use.  Current Meds:  Outpatient Encounter Prescriptions as of 02/27/2014  Medication Sig  . Aspirin-Salicylamide-Caffeine (BC HEADACHE POWDER PO) Take by mouth.  . ciprofloxacin (CIPRO) 500 MG tablet Take 1 tablet (500 mg total) by mouth 2 (two) times daily. One po bid x 7 days  . lidocaine (XYLOCAINE) 5 % ointment Apply 1 application topically 2 (two) times daily as needed.  Marland Kitchen lisinopril (PRINIVIL,ZESTRIL) 40 MG tablet Take 1 tablet by mouth every evening.     Allergy:  Allergies  Allergen Reactions  . Streptomycin     Any mycin drugs    Social Hx:   History   Social History  . Marital Status: Widowed    Spouse Name: N/A    Number of Children: N/A  . Years of Education: N/A   Occupational History  . Not on file.   Social History Main Topics  . Smoking status: Current Every Day Smoker -- 0.50 packs/day for 48 years    Types: Cigarettes  . Smokeless tobacco: Former Systems developer    Types: Chew  . Alcohol Use: No  . Drug Use: No  . Sexual  Activity: Not Currently    Birth Control/ Protection: Post-menopausal   Other Topics Concern  . Not on file   Social History Narrative  . No narrative on file    Past Surgical Hx:  Past Surgical History  Procedure Laterality Date  . Tubal ligation    . Appendectomy    . Eye surgery Left     cataract removal and was blinded.    Past Medical Hx:  Past Medical History  Diagnosis Date  . Hypertension   . Frequent UTI   . Hard of hearing   . Vision loss, bilateral   . Measles   . Vulvar cancer 02/09/14    Past Gynecological History:  5 vaginal deliveries. No history of vulvar dysplasia, no history of HPV disease, no history of urinary incontinence. No history of lichen sclerosis. No LMP recorded. Patient is postmenopausal.  Family Hx:  Family History  Problem Relation Age of Onset  . Heart disease Mother   . Early death Father   . Anuerysm Sister     stomach  . Cancer Brother   . Hypertension Daughter   . Hyperlipidemia Daughter   . Cancer Son     bladder  . Heart disease Brother   . Hypertension Son   .  Hypertension Son   . Hypertension Daughter     Review of Systems:  Constitutional  Feels well,  with the exception of history present complaint.  ENT Normal appearing ears and nares bilaterally Skin/Breast  No rash, sores, jaundice, itching, dryness Cardiovascular  No chest pain, shortness of breath, or edema  Pulmonary  No cough or wheeze.  Gastro Intestinal  No nausea, vomitting, or diarrhoea. No bright red blood per rectum, no abdominal pain, change in bowel movement, or constipation.  Genito Urinary  No frequency, urgency, dysuria, see history of present illness Musculo Skeletal  No myalgia, arthralgia, joint swelling or pain  Neurologic  No weakness, numbness, change in gait,  Psychology  No depression, anxiety, insomnia.   Vitals:  Blood pressure 196/89, pulse 77, temperature 97.8 F (36.6 C), temperature source Oral, resp. rate 16, height 5'  0.95" (1.548 m), weight 121 lb 6.4 oz (55.067 kg).  Physical Exam: WD in NAD Neck  Supple NROM, without any enlargements.  Lymph Node Survey No cervical supraclavicular or inguinal adenopathy. Cardiovascular  Pulse normal rate, regularity and rhythm. S1 and S2 normal.  Lungs  Clear to auscultation bilateraly, without wheezes/crackles/rhonchi. Good air movement.  Skin  No rash/lesions/breakdown  Psychiatry  Alert and oriented to person, place, and time  Abdomen  Normoactive bowel sounds, abdomen soft, non-tender and non-obese. Presence of a left inguinal reducable soft hernia.   Back No CVA tenderness Genito Urinary  Vulva/vagina:  A 2.5 cm, raised, erythematous, nodular lesion, which is discoid in shape, is identified in the mid right labia minora. This 2 cm from the urethral meatus and midline anteriorly. It is greater than 5 cm from the midline perineal structures posteriorly and anus. Is tender to palpation. The surrounding skin of the labia majora and minora it is bright red and irritated and somewhat desquamated.   Bladder/urethra:  No lesions or masses, well supported bladder  Vagina: Deferred  Cervix: deferred exam  Uterus: deferred exam  Adnexa: deferred exam  Rectal : Deferred exam Extremities  No bilateral cyanosis, clubbing or edema.   @[facility] @ 02/27/2014, 12:39 PM

## 2014-03-28 NOTE — Transfer of Care (Signed)
Immediate Anesthesia Transfer of Care Note  Patient: Erin Mckay  Procedure(s) Performed: Procedure(s): WIDE LOCAL EXCISION OF RIGHT VULVAR (Right)  Patient Location: PACU  Anesthesia Type:Spinal  Level of Consciousness: awake, alert , oriented and patient cooperative  Airway & Oxygen Therapy: Patient Spontanous Breathing and Patient connected to face mask oxygen  Post-op Assessment: Report given to PACU RN and Post -op Vital signs reviewed and stable  Post vital signs: Reviewed and stable  Complications: No apparent anesthesia complications

## 2014-03-28 NOTE — Interval H&P Note (Signed)
History and Physical Interval Note:  03/28/2014 7:43 AM  [name]  has presented today for surgery, with the diagnosis of vulvar cancer   The various methods of treatment have been discussed with the patient and family. After consideration of risks, benefits and other options for treatment, the patient has consented to  Procedure(s): WIDE LOCAL EXCISION OF RIGHT VULVAR (N/A) as a surgical intervention .  The patient's history has been reviewed, patient examined, no change in status, stable for surgery.  I have reviewed the patient's chart and labs.  Questions were answered to the patient's satisfaction.     Erin Mckay

## 2014-03-28 NOTE — Anesthesia Postprocedure Evaluation (Signed)
  Anesthesia Post-op Note  Patient: Erin Mckay  Procedure(s) Performed: Procedure(s): WIDE LOCAL EXCISION OF RIGHT VULVAR (Right)  Patient Location: PACU  Anesthesia Type:Spinal  Level of Consciousness: awake, alert  and oriented  Airway and Oxygen Therapy: Patient Spontanous Breathing  Post-op Pain: none  Post-op Assessment: Post-op Vital signs reviewed. Spinal resolving.  Post-op Vital Signs: Reviewed  Last Vitals:  Filed Vitals:   03/28/14 1153  BP: 169/70  Pulse: 55  Temp: 36.3 C  Resp: 16    Complications: No apparent anesthesia complications

## 2014-03-28 NOTE — Anesthesia Preprocedure Evaluation (Addendum)
Anesthesia Evaluation  Patient identified by MRN, date of birth, ID band Patient awake    Reviewed: Allergy & Precautions, H&P , NPO status , Patient's Chart, lab work & pertinent test results  Airway Mallampati: III TM Distance: >3 FB Neck ROM: Full    Dental  (+) Edentulous Upper, Edentulous Lower   Pulmonary Current Smoker,  breath sounds clear to auscultation        Cardiovascular hypertension, Rhythm:Regular Rate:Normal     Neuro/Psych negative neurological ROS     GI/Hepatic negative GI ROS, Neg liver ROS,   Endo/Other  negative endocrine ROS  Renal/GU negative Renal ROS   Vulvar cancer    Musculoskeletal negative musculoskeletal ROS (+)   Abdominal   Peds  Hematology negative hematology ROS (+)   Anesthesia Other Findings   Reproductive/Obstetrics negative OB ROS                         Anesthesia Physical Anesthesia Plan  ASA: III  Anesthesia Plan: Spinal   Post-op Pain Management:    Induction:   Airway Management Planned: Simple Face Mask  Additional Equipment: None  Intra-op Plan:   Post-operative Plan:   Informed Consent: I have reviewed the patients History and Physical, chart, labs and discussed the procedure including the risks, benefits and alternatives for the proposed anesthesia with the patient or authorized representative who has indicated his/her understanding and acceptance.   Dental advisory given  Plan Discussed with: CRNA, Anesthesiologist and Surgeon  Anesthesia Plan Comments:        Anesthesia Quick Evaluation

## 2014-03-28 NOTE — Op Note (Signed)
PATIENT: Erin Mckay DATE: 03/28/14   Preop Diagnosis: clinical stage IB squamous cell carcinoma of the vulva  Postoperative Diagnosis: same  Surgery: simple partial right vulvectomy   Surgeons:  Donaciano Eva, MD Assistant: none  Anesthesia: Spinal and local.  Estimated blood loss: 25 ml  IVF:  353ml   Urine output: 643 ml   Complications: None   Pathology: right labia minora with marking stitch at 12 o'clock  Operative findings: 2.5cm discoid raised plaque on mid right labia minora, approximating urethral meatus (right side) - gross macroscopic margin was <1cm to urethral meatus. Surrounding, circumferential tissues were erythematous and consistent with vulvar dystrophy. Nonpalpable lymph nodes in groins.  Procedure: The patient was identified in the preoperative holding area. Informed consent was signed on the chart. Patient was seen history was reviewed and exam was performed.   The patient was then taken to the operating room and placed in the supine position with SCD hose on. General anesthesia was then induced without difficulty. She was then placed in the dorsolithotomy position. The perineum was prepped with Betadine. The vagina was prepped with Betadine. The patient was then draped after the prep was dried. A foley catheter to empty the bladder was placed under sterile conditions.  Timeout was performed the patient, procedure, antibiotic, allergy, and length of procedure.   The lesion was identified on the right labia minora. The site of surgical excision was marked with a marking pen. The dermal plane surrounding the site of incision and bed of lesion was infiltrated with 1% lidocaine. A 10 blade scalpel was used to circumferentially excise the elipse of skin on the right vulva. The bovie electrosurgical device was used to separate the elipse in a hemostatic fasion. The specimen was handed off the field and marked at 12 o'clock with a suture to  orient. The bovie was used to achieve hemostasis at the surgical bed. 2-0 and 3-0 vicryl interrupted mattress sutures were used to reapproximate the deeper layers.  4-0 vicryl interrupted sutures were used to close the skin.   All instrument, suture, laparotomy, Ray-Tec, and needle counts were correct x2. The patient tolerated the procedure well and was taken recovery room in stable condition. This is Erin Mckay dictating an operative note on Erin Mckay.

## 2014-03-28 NOTE — Discharge Instructions (Signed)
Spinal Anesthesia Care After HOME CARE INSTRUCTIONS  Do not drive or operate machinery for at least 24 hours after receiving anesthesia. Make sure someone is available to drive you home.  Do not drink alcohol for at least 24 hours after receiving anesthesia.  Do not make important decisions for 24 hours after having spinal or epidural anesthesia. Your thinking may be unclear.  Have someone stay with you for at least 24 to 48 hours following surgery.  Drink lots of fluids when you get home. If you are an adult, drink eight, 8 ounce glasses of water per day, or as directed.  Keep your post-operative appointments as suggested. SEEK IMMEDIATE MEDICAL CARE IF:   You develop a fever or any temperature over 100.4 F (38 C).  You have a persistent or severe headache.  You develop blurred or double vision.  You develop dizziness, fainting or lightheadedness.  You have weakness, numbness, or tingling in your arms or legs.  You develop a skin rash.  You have difficulty breathing.  You have persistent nausea and vomiting.  You are unable to pass urine. Document Released: 10/25/2003 Document Revised: 10/27/2011 Document Reviewed: 09/07/2007 [facility] Patient Information 2015 [place], [place]. This information is not intended to replace advice given to you by your health care provider. Make sure you discuss any questions you have with your health care provider.

## 2014-03-28 NOTE — Progress Notes (Signed)
Patient is able to ambulate around short stay with walker and gait belt after spinal anesthesia. She exhibits poor short term memory stating she cannot walk because she is so numb. Able to void small amount of clear yellow urine in bathroom ( had foley discontinued at 11 AM this AM). She states she is unable to void because she is so numb down there. Her daughter, Shauna Hugh, was instructed in discharge instructions and given prescription for pain medications. Diane left about 3 PM with out patient as patient was not able to void by then. Patient is to go home with her daughter, Velva Harman. Writer will review discharge instructions with Velva Harman.

## 2014-03-28 NOTE — Anesthesia Procedure Notes (Signed)
Spinal  Patient location during procedure: OR Start time: 03/28/2014 10:10 AM Staffing Anesthesiologist: Suzette Battiest E Performed by: anesthesiologist  Preanesthetic Checklist Completed: patient identified, site marked, surgical consent, pre-op evaluation, timeout performed, IV checked, risks and benefits discussed and monitors and equipment checked Spinal Block Patient position: sitting Prep: Betadine Patient monitoring: heart rate, cardiac monitor, continuous pulse ox and blood pressure Approach: midline Location: L4-5 Injection technique: single-shot Needle Needle type: Spinocan  Needle gauge: 25 G Needle length: 9 cm Additional Notes Prepped and draped in sterile fashion. CSF aspiration before and after injection of LA. 12mg  0.75% bupivicaine injected intrathecally.

## 2014-03-30 ENCOUNTER — Telehealth: Payer: Self-pay | Admitting: *Deleted

## 2014-03-30 ENCOUNTER — Encounter (HOSPITAL_COMMUNITY): Payer: Self-pay | Admitting: Gynecologic Oncology

## 2014-03-30 NOTE — Telephone Encounter (Signed)
Returned pt's call regarding bleeding. Velva Harman pt's caregiver advised "he bleeding stopped after she put her in the bed and put iceon the area of concern.It appeared  stitch had come loose. She's okay now and hasn't  Had anymore bleeding. " Informed Velva Harman to call back if pt continues to bleed, not to take any aspirin as this can increase risk of bleeding. No further concerns at this time. Requested pt or Velva Harman call back on Friday to give update on pt's condition. Velva Harman verbalized understanding.

## 2014-04-10 ENCOUNTER — Telehealth: Payer: Self-pay | Admitting: *Deleted

## 2014-04-10 ENCOUNTER — Ambulatory Visit: Payer: Medicare Other | Admitting: Obstetrics and Gynecology

## 2014-04-10 NOTE — Telephone Encounter (Signed)
Pt's friend Diane called with concern, pt had bilateral swelling to ankles over the weekend.  Diane advised she encouraged pt to drink more water and keep feet elevated, swelling decreased by Sunday. No further concerns. Discussed with Diane to monitor for additional swelling, watch sodium intake, call office with any concerns.

## 2014-04-28 ENCOUNTER — Encounter: Payer: Self-pay | Admitting: Gynecologic Oncology

## 2014-04-28 ENCOUNTER — Ambulatory Visit: Payer: Medicare Other | Attending: Gynecologic Oncology | Admitting: Gynecologic Oncology

## 2014-04-28 VITALS — BP 178/71 | HR 74 | Temp 98.2°F | Resp 16 | Ht 60.94 in | Wt 123.2 lb

## 2014-04-28 DIAGNOSIS — I10 Essential (primary) hypertension: Secondary | ICD-10-CM | POA: Insufficient documentation

## 2014-04-28 DIAGNOSIS — F17200 Nicotine dependence, unspecified, uncomplicated: Secondary | ICD-10-CM | POA: Diagnosis not present

## 2014-04-28 DIAGNOSIS — B373 Candidiasis of vulva and vagina: Secondary | ICD-10-CM

## 2014-04-28 DIAGNOSIS — N94819 Vulvodynia, unspecified: Secondary | ICD-10-CM | POA: Insufficient documentation

## 2014-04-28 DIAGNOSIS — Z780 Asymptomatic menopausal state: Secondary | ICD-10-CM | POA: Diagnosis not present

## 2014-04-28 DIAGNOSIS — B3731 Acute candidiasis of vulva and vagina: Secondary | ICD-10-CM | POA: Diagnosis not present

## 2014-04-28 DIAGNOSIS — C519 Malignant neoplasm of vulva, unspecified: Secondary | ICD-10-CM | POA: Insufficient documentation

## 2014-04-28 MED ORDER — NYSTATIN 100000 UNIT/GM EX CREA: 1.0000 "application " | TOPICAL_CREAM | Freq: Two times a day (BID) | CUTANEOUS | Status: DC

## 2014-04-28 NOTE — Progress Notes (Signed)
HPI:  Erin Mckay is a 80 y.o. year old No obstetric history on file. initially seen in consultation on 01/28/14 for clinical stage IB vulvar SCC.  She and her family were counseled that optimal treatment of her condition would be a radical vulvectomy with lymphadenectomy. She and her family declined this as they felt strongly that she would not recover adequately from such a radical procedure and were interested in only minimal therapy for symptom relief. We discussed the option of primary radiation, however they declined this given their concerns about morbidity from skin burn. We elected to proceed with a palliative "toiletting" partial vulvectomy understanding that adequacy of margin status would be compromised, though this would likely be an easier procedure to recover from and she would achieve some relief of symptoms or irritation from the cancer.   She then underwent a simple partial right vulvectomy on 4/40/10 without complications.  Her postoperative course was uncomplicated.  Her final pathology revealed squamous cell carcinoma of the vulva, clinical stage IB. She had positive microscopic margins for invasive disease.  She is seen today for a postoperative check and to discuss her pathology results and ongoing plan.  Since discharge from the hospital, she is feeling well with substantially improved vulvodynia. The "itch and burning" that she had from the cancer preoperatively have resolved. She does however have moderate symptoms of redness and irritation that preceded the surgery.  She has good appetite, has gained weight, has normal bowel and bladder function, and pain controlled with minimal PO medication. She has no other complaints today.  Allergies  Allergen Reactions  . Pineapple Swelling  . Streptomycin     Any mycin drugs    Outpatient Encounter Prescriptions as of 04/28/2014  Medication Sig Note  . amLODipine (NORVASC) 5 MG tablet Take 5 mg by mouth every evening.   .  Aspirin-Salicylamide-Caffeine (BC HEADACHE POWDER PO) Take 1 application by mouth every 6 (six) hours as needed (pain).    Marland Kitchen docusate sodium (COLACE) 100 MG capsule Take 1 capsule (100 mg total) by mouth 2 (two) times daily.   Marland Kitchen lidocaine (XYLOCAINE) 5 % ointment  04/28/2014: Received from: External Pharmacy  . lisinopril (PRINIVIL,ZESTRIL) 40 MG tablet Take 40 mg by mouth at bedtime.    Marland Kitchen oxyCODONE-acetaminophen (PERCOCET/ROXICET) 5-325 MG per tablet Take 1 tablet by mouth every 4 (four) hours as needed for severe pain.   Marland Kitchen nystatin cream (MYCOSTATIN) Apply 1 application topically 2 (two) times daily.    Past Medical History  Diagnosis Date  . Hypertension   . Frequent UTI   . Hard of hearing   . Vision loss, bilateral   . Measles   . Vulvar cancer 02/09/14  . Cataract     03-20-14 right eye" afraid to have surgery"  . Hemorrhoids     remains a problem.  . Vulvar cancer 03/28/14   Past Surgical History  Procedure Laterality Date  . Tubal ligation    . Appendectomy    . Eye surgery Left     cataract removal and was blinded.  . Cataract extraction Left     "blinded" s/p cataract surgery, cataract on left -"afraid to have surgery"  . Vulvectomy Right 03/28/2014    Procedure: WIDE LOCAL EXCISION OF RIGHT VULVAR;  Surgeon: Everitt Amber, MD;  Location: [facility];  Service: Gynecology;  Laterality: Right;   Family History  Problem Relation Age of Onset  . Heart disease Mother   . Early death Father   .  Anuerysm Sister     stomach  . Cancer Brother   . Hypertension Daughter   . Hyperlipidemia Daughter   . Cancer Son     bladder  . Heart disease Brother   . Hypertension Son   . Hypertension Son   . Hypertension Daughter    History   Social History  . Marital Status: Widowed    Spouse Name: N/A    Number of Children: N/A  . Years of Education: N/A   Social History Main Topics  . Smoking status: Current Every Day Smoker -- 0.50 packs/day for 48 years    Types: Cigarettes  .  Smokeless tobacco: Former Systems developer    Types: Chew  . Alcohol Use: No  . Drug Use: No  . Sexual Activity: Not Currently    Birth Control/ Protection: Post-menopausal   Other Topics Concern  . None   Social History Narrative  . None    Review of systems: Constitutional:  She has no weight gain or weight loss. She has no fever or chills. Eyes: No blurred vision Ears, Nose, Mouth, Throat: No dizziness, headaches or changes in hearing. No mouth sores. Cardiovascular: No chest pain, palpitations or edema. Respiratory:  No shortness of breath, wheezing or cough Gastrointestinal: She has normal bowel movements without diarrhea or constipation. She denies any nausea or vomiting. She denies blood in her stool or heart burn. Genitourinary:  See HPI Musculoskeletal: Denies muscle weakness or joint pains.  Skin:  She has no skin changes, rashes or itching Neurological:  Denies dizziness or headaches. No neuropathy, no numbness or tingling. Psychiatric:  She denies depression or anxiety. Hematologic/Lymphatic:   No easy bruising or bleeding  Physical Exam: Blood pressure 178/71, pulse 74, temperature 98.2 F (36.8 C), temperature source Oral, resp. rate 16, height 5' 0.94" (1.548 m), weight 123 lb 3.2 oz (55.883 kg). General: Well dressed, well nourished in no apparent distress.   HEENT:  Normocephalic and atraumatic, no lesions.  Extraocular muscles intact. Sclerae anicteric. Pupils equal, round, reactive. No mouth sores or ulcers. Thyroid is normal size, not nodular, midline. Skin:  No lesions or rashes. Breasts:  deferred. Lungs:  Clear to auscultation bilaterally.  No wheezes. Cardiovascular:  Regular rate and rhythm.  No murmurs or rubs. Abdomen:  Soft, nontender, nondistended.  No palpable masses.  No hepatosplenomegaly.  No ascites. Normal bowel sounds.  No hernias.   Genitourinary: right vulva incision healing well (some mild separation at most medial aspect. No gross visible tumor.  Circumferential beefy red erythema of vulva consistent with vulvar candidiasis. Extremities: No cyanosis, clubbing or edema.  No calf tenderness or erythema. No palpable cords. Psychiatric: Mood and affect are appropriate. Neurological: Awake, alert and oriented x 3. Sensation is intact, no neuropathy.  Musculoskeletal: No pain, normal strength and range of motion.  Assessment:    80 y.o. year old with clinical stage IB vulvar SCC.   S/p palliative simple partial right vulvectomy on 03/28/14.   Plan: 1) Pathology reports reviewed today 2) Treatment counseling - I reviewed again today with Yarisbel and her daughter that her surgery was not curative, and that based on microscopic examination she has residual cancer at the margins of the specimen (and therefore still has residual vulvar cancer, though it is not detectable by physical examination because it is microscopic). I discussed that with this pathology result, recurrence is certain, though over what time frame is also uncertain. I revisited what their treatment goals are/were and they again stated that  they did not wish to have radical surgery (including radical vulvectomy). They are interested in symptoms resolution/palliation, and are happy that this surgery provided that. They were offered re-excision to attempt to obtain negative margins, though they declined this because they were so happy with how Elexis healed from the first surgery, and that her symptoms have resolved and don't wish for her to endure a more radical procedure. They were offered radiation to reduce the risk for recurrence, however, they declined this also because they understand that vulvar radiation is associated with symptoms of irritation and burn which are the very symptoms we had tried to palliate with her surgery.  They have elected for close followup. I recommend 3 monthly surveillance exams of the perineum. If the lesion recurs macroscopically, we could evaluate for  palliative re-excision at that time. I discussed with Aveah and her daughter that pending the location and extent of recurrence, surgery may not be possible in the future.  The patient is electing for followup with Dr Glo Herring in [place] as this is closer.  She was given the opportunity to ask questions, which were answered to her satisfaction, and she is agreement with the above mentioned plan of care.  3)  Vulvovaginal candidiasis - prescribed miconazole  4) Return to clinic to see Dr Glo Herring in 3 months and with me prn.  Donaciano Eva, MD

## 2014-04-28 NOTE — Patient Instructions (Signed)
Blood Pressure Record Sheet Your blood pressure on this visit to the emergency department or clinic is elevated. This does not necessarily mean you have high blood pressure (hypertension), but it does mean that your blood pressure needs to be rechecked. Many times your blood pressure can increase due to illness, pain, anxiety, or other factors. We recommend that you get a series of blood pressure readings done over a period of 5 days. It is best to get a reading in the morning and one in the evening. You should make sure to sit and relax for 1-5 minutes before the reading is taken. Write the readings down and make a follow-up appointment with your health care provider to discuss the results. If there is not a free clinic or a drug store with a blood-pressure-taking machine near you, you can purchase blood-pressure-taking equipment from a drug store. Having one in the home allows you the convenience of taking your blood pressure while you are home and relaxed.  Your blood pressure in the emergency department or clinic on ________ was ____________________. BLOOD PRESSURE LOG Date: _______________________  a.m. _____________________  p.m. _____________________ Date: _______________________  a.m. _____________________  p.m. _____________________ Date: _______________________  a.m. _____________________  p.m. _____________________ Date: _______________________  a.m. _____________________  p.m. _____________________ Date: _______________________  a.m. _____________________  p.m. _____________________ Document Released: 05/03/2003 Document Revised: 12/19/2013 Document Reviewed: 09/27/2013 [employer] Patient Information 2015 [employer]. This information is not intended to replace advice given to you by your health care provider. Make sure you discuss any questions you have with your health care provider.  

## 2014-05-10 ENCOUNTER — Encounter: Payer: Self-pay | Admitting: Obstetrics and Gynecology

## 2014-05-10 ENCOUNTER — Telehealth: Payer: Self-pay | Admitting: Obstetrics and Gynecology

## 2014-05-10 MED ORDER — HYDROCORTISONE 2.5 % RE CREA: 1.0000 "application " | TOPICAL_CREAM | Freq: Two times a day (BID) | RECTAL | Status: DC

## 2014-05-10 NOTE — Telephone Encounter (Signed)
rx anusol called in refil x 3

## 2014-05-17 ENCOUNTER — Ambulatory Visit: Payer: Medicare Other | Admitting: Obstetrics and Gynecology

## 2014-05-23 ENCOUNTER — Encounter: Payer: Self-pay | Admitting: Gastroenterology

## 2014-05-23 ENCOUNTER — Ambulatory Visit (INDEPENDENT_AMBULATORY_CARE_PROVIDER_SITE_OTHER): Payer: Medicare Other | Admitting: Gastroenterology

## 2014-05-23 VITALS — BP 185/80 | HR 78 | Temp 97.5°F | Ht 62.0 in | Wt 124.2 lb

## 2014-05-23 DIAGNOSIS — K648 Other hemorrhoids: Secondary | ICD-10-CM

## 2014-05-23 NOTE — Assessment & Plan Note (Signed)
80 year old female presenting as self-referral secondary to chronic hemorrhoids. Physical exam with apparent non-thrombosed hemorrhoids noted externally, internal exam without mass present, query internal hemorrhoids. From her description, sounds like she has intermittent prolapsing hemorrhoids. Discussed avoidance of straining, increased fiber intake, and supportive measures. She is agreeable to try a stool softener that has already been prescribed. I have called in a specially compounded cream to [facility] to take BID X 10 days. Supplemental fiber daily. Return in 4 weeks. As of note, no prior colonoscopy, and patient is declining any invasive procedure.

## 2014-05-23 NOTE — Progress Notes (Signed)
cc'ed to pcp °

## 2014-05-23 NOTE — Progress Notes (Signed)
Primary Care Physician:  Octavio Graves, DO Primary Gastroenterologist:  Dr. Oneida Alar  Chief Complaint  Patient presents with  . Hemorrhoids    HPI:   Erin Mckay presents today as a self-referral secondary to hemorrhoids. Does not desire any invasive procedure. Diagnosed with stage 1B vulvar SCC s/p palliative simple partial right vulvectomy on 03/28/14. History of chronic hemorrhoids. Rectal discomfort. Scant hematochezia. Some straining. States hemorrhoids will come out and be the size of a grape. Has stool softeners to take but won't use. Doesn't believe in medication. No prior colonoscopy.   Past Medical History  Diagnosis Date  . Hypertension   . Frequent UTI   . Hard of hearing   . Vision loss, bilateral   . Measles   . Vulvar cancer 02/09/14  . Cataract     03-20-14 right eye" afraid to have surgery"  . Hemorrhoids     remains a problem.  . Vulvar cancer 03/28/14    Past Surgical History  Procedure Laterality Date  . Tubal ligation    . Appendectomy    . Eye surgery Left     cataract removal and was blinded.  . Cataract extraction Left     "blinded" s/p cataract surgery, cataract on left -"afraid to have surgery"  . Vulvectomy Right 03/28/2014    Procedure: WIDE LOCAL EXCISION OF RIGHT VULVAR;  Surgeon: Everitt Amber, MD;  Location: [facility];  Service: Gynecology;  Laterality: Right;    Current Outpatient Prescriptions  Medication Sig Dispense Refill  . Aspirin-Salicylamide-Caffeine (BC HEADACHE POWDER PO) Take 1 application by mouth every 6 (six) hours as needed (pain).       . hydrocortisone (ANUSOL-HC) 2.5 % rectal cream Place 1 application rectally 2 (two) times daily.  30 g  3  . lisinopril (PRINIVIL,ZESTRIL) 40 MG tablet Take 40 mg by mouth at bedtime.        No current facility-administered medications for this visit.    Allergies as of 05/23/2014 - Review Complete 05/23/2014  Allergen Reaction Noted  . Pineapple Swelling 03/17/2014  . Streptomycin   02/03/2014    Family History  Problem Relation Age of Onset  . Heart disease Mother   . Early death Father   . Anuerysm Sister     stomach  . Cancer Brother   . Hypertension Daughter   . Hyperlipidemia Daughter   . Cancer Son     bladder  . Heart disease Brother   . Hypertension Son   . Hypertension Son   . Hypertension Daughter   . Colon cancer Neg Hx     History   Social History  . Marital Status: Widowed    Spouse Name: N/A    Number of Children: N/A  . Years of Education: N/A   Occupational History  . Not on file.   Social History Main Topics  . Smoking status: Current Every Day Smoker -- 0.50 packs/day for 48 years    Types: Cigarettes  . Smokeless tobacco: Former Systems developer    Types: Chew  . Alcohol Use: No  . Drug Use: No  . Sexual Activity: Not Currently    Birth Control/ Protection: Post-menopausal   Other Topics Concern  . Not on file   Social History Narrative  . No narrative on file    Review of Systems: Gen: see HPI CV: Denies chest pain, heart palpitations, peripheral edema, syncope.  Resp: Denies shortness of breath at rest or with exertion. Denies wheezing or cough.  GI: see HPI GU : Denies urinary burning, urinary frequency, urinary hesitancy MS: Denies joint pain, muscle weakness, cramps, or limitation of movement.  Derm: Denies rash, itching, dry skin Psych: +intermittent confusion  Heme: Denies bruising, bleeding, and enlarged lymph nodes.  Physical Exam: BP 185/80  Pulse 78  Temp(Src) 97.5 F (36.4 C) (Oral)  Ht 5\' 2"  (1.575 m)  Wt 124 lb 3.2 oz (56.337 kg)  BMI 22.71 kg/m2 General:   Alert and oriented. Pleasant and cooperative. Well-nourished and well-developed.  Head:  Normocephalic and atraumatic. Eyes:  Without icterus, sclera clear and conjunctiva pink.  Ears:  Moderately hard of hearing Nose:  No deformity, discharge,  or lesions. Mouth:  edentulous.  Lungs:  Clear to auscultation bilaterally. Diminished bases Heart:   S1, S2 present  Abdomen:  +BS, soft, non-tender and non-distended. No HSM noted.  Rectal:  External non-thrombosed pea-sized hemorrhoids, internal exam without obvious mass or gross blood, stool noted in rectal vault Msk:  Kyphosis  Extremities:  Without r edema. Neurologic:  Alert and  oriented x4;  grossly normal neurologically. Psych:  Alert and cooperative. Normal mood and affect.

## 2014-05-23 NOTE — Patient Instructions (Addendum)
I sent a special cream to [facility] to take per rectum twice a day for 10 days. They will be calling your daughter to get your insurance information.  Start taking supplemental fiber such as Metamucil or Benefiber daily. Drink plenty of water to keep urine light yellow or clear.   Take the stool softener once to twice a day.   I will see you in 4 weeks!  Hemorrhoids Hemorrhoids are puffy (swollen) veins around the rectum or anus. Hemorrhoids can cause pain, itching, bleeding, or irritation. HOME CARE  Eat foods with fiber, such as whole grains, beans, nuts, fruits, and vegetables. Ask your doctor about taking products with added fiber in them (fibersupplements).  Drink enough fluid to keep your pee (urine) clear or pale yellow.  Exercise often.  Go to the bathroom when you have the urge to poop. Do not wait.  Avoid straining to poop (bowel movement).  Keep the butt area dry and clean. Use wet toilet paper or moist paper towels.  Medicated creams and medicine inserted into the anus (anal suppository) may be used or applied as told.  Only take medicine as told by your doctor.  Take a warm water bath (sitz bath) for 15-20 minutes to ease pain. Do this 3-4 times a day.  Place ice packs on the area if it is tender or puffy. Use the ice packs between the warm water baths.  Put ice in a plastic bag.  Place a towel between your skin and the bag.  Leave the ice on for 15-20 minutes, 03-04 times a day.  Do not use a donut-shaped pillow or sit on the toilet for a long time. GET HELP RIGHT AWAY IF:   You have more pain that is not controlled by treatment or medicine.  You have bleeding that will not stop.  You have trouble or are unable to poop (bowel movement).  You have pain or puffiness outside the area of the hemorrhoids. MAKE SURE YOU:   Understand these instructions.  Will watch your condition.  Will get help right away if you are not doing well or get  worse. Document Released: 05/13/2008 Document Revised: 07/21/2012 Document Reviewed: 06/15/2012 [facility] Patient Information 2015 [place], [place]. This information is not intended to replace advice given to you by your health care provider. Make sure you discuss any questions you have with your health care provider.

## 2014-06-26 ENCOUNTER — Ambulatory Visit: Payer: Medicare Other | Admitting: Gastroenterology

## 2014-07-18 ENCOUNTER — Other Ambulatory Visit: Payer: Self-pay

## 2014-07-18 ENCOUNTER — Ambulatory Visit (INDEPENDENT_AMBULATORY_CARE_PROVIDER_SITE_OTHER): Payer: Medicare Other | Admitting: Gastroenterology

## 2014-07-18 ENCOUNTER — Encounter: Payer: Self-pay | Admitting: Gastroenterology

## 2014-07-18 VITALS — HR 80 | Temp 97.8°F | Ht 61.0 in | Wt 124.8 lb

## 2014-07-18 DIAGNOSIS — K648 Other hemorrhoids: Secondary | ICD-10-CM

## 2014-07-18 MED ORDER — HYDROCORTISONE 2.5 % RE CREA: 1.0000 "application " | TOPICAL_CREAM | Freq: Two times a day (BID) | RECTAL | Status: DC

## 2014-07-18 NOTE — Progress Notes (Signed)
Primary Care Physician:  Octavio Graves, DO Primary Gastroenterologist:  Dr. Oneida Alar  Chief Complaint  Patient presents with  . Follow-up    HPI:   Erin Mckay presents today in follow-up for hemorrhoids. Last seen in Oct 2015 as a self-referral secondary to hemorrhoids. Diagnosed with stage 1B vulvar SCC s/p palliative simple partial right vulvectomy on 03/28/14. History of chronic hemorrhoids. Rectal discomfort. Scant hematochezia. Some straining. States hemorrhoids will come out and be the size of a grape. Has stool softeners to take but won't use. Doesn't believe in medication. No prior colonoscopy. Returns today stating she would like more cream. Still without much relief despite avoiding constipation.Hemorrhoids prolapsed "all day". Sits in "ice water" to relieve. Miserable.   Past Medical History  Diagnosis Date  . Hypertension   . Frequent UTI   . Hard of hearing   . Vision loss, bilateral   . Measles   . Vulvar cancer 02/09/14  . Cataract     03-20-14 right eye" afraid to have surgery"  . Hemorrhoids     remains a problem.  . Vulvar cancer 03/28/14    Past Surgical History  Procedure Laterality Date  . Tubal ligation    . Appendectomy    . Eye surgery Left     cataract removal and was blinded.  . Cataract extraction Left     "blinded" s/p cataract surgery, cataract on left -"afraid to have surgery"  . Vulvectomy Right 03/28/2014    Procedure: WIDE LOCAL EXCISION OF RIGHT VULVAR;  Surgeon: Everitt Amber, MD;  Location: [facility];  Service: Gynecology;  Laterality: Right;    Current Outpatient Prescriptions  Medication Sig Dispense Refill  . Aspirin-Salicylamide-Caffeine (BC HEADACHE POWDER PO) Take 1 application by mouth every 6 (six) hours as needed (pain).     Marland Kitchen lisinopril (PRINIVIL,ZESTRIL) 40 MG tablet Take 40 mg by mouth at bedtime.     . hydrocortisone (ANUSOL-HC) 2.5 % rectal cream Place 1 application rectally 2 (two) times daily. (Patient not taking:  Reported on 07/18/2014) 30 g 3   No current facility-administered medications for this visit.    Allergies as of 07/18/2014 - Review Complete 05/23/2014  Allergen Reaction Noted  . Pineapple Swelling 03/17/2014  . Streptomycin  02/03/2014    Family History  Problem Relation Age of Onset  . Heart disease Mother   . Early death Father   . Anuerysm Sister     stomach  . Cancer Brother   . Hypertension Daughter   . Hyperlipidemia Daughter   . Cancer Son     bladder  . Heart disease Brother   . Hypertension Son   . Hypertension Son   . Hypertension Daughter   . Colon cancer Neg Hx     History   Social History  . Marital Status: Widowed    Spouse Name: N/A    Number of Children: N/A  . Years of Education: N/A   Occupational History  . Not on file.   Social History Main Topics  . Smoking status: Current Every Day Smoker -- 0.50 packs/day for 48 years    Types: Cigarettes  . Smokeless tobacco: Former Systems developer    Types: Chew  . Alcohol Use: No  . Drug Use: No  . Sexual Activity: Not Currently    Birth Control/ Protection: Post-menopausal   Other Topics Concern  . Not on file   Social History Narrative    Review of Systems: Gen: see HPI CV: Denies  chest pain, heart palpitations, peripheral edema, syncope.  Resp: Denies shortness of breath at rest or with exertion. Denies wheezing or cough.  GI: see HPI GU : Denies urinary burning, urinary frequency, urinary hesitancy MS: Denies joint pain, muscle weakness, cramps, or limitation of movement.  Derm: Denies rash, itching, dry skin Psych: +intermittent confusion  Heme: Denies bruising, bleeding, and enlarged lymph nodes.  Physical Exam: Pulse 80  Temp(Src) 97.8 F (36.6 C) (Oral)  Ht 5\' 1"  (1.549 m)  Wt 124 lb 12.8 oz (56.609 kg)  BMI 23.59 kg/m2 General:   Alert and oriented. Pleasant and cooperative. Well-nourished and well-developed.  Head:  Normocephalic and atraumatic. Eyes:  Without icterus, sclera clear  and conjunctiva pink.  Ears:  Moderately hard of hearing Nose:  No deformity, discharge,  or lesions. Mouth:  edentulous.  Lungs:  Clear to auscultation bilaterally. Diminished bases Heart:  S1, S2 present  Abdomen:  +BS, soft, non-tender and non-distended. No HSM noted.  Rectal:  Small internal hemorrhoids, prolapsed, non-thrombosed, appear lima-bean size Msk:  Kyphosis  Extremities:  Without edema. Neurologic:  Alert and  oriented x4;  grossly normal neurologically. Psych:  Alert and cooperative. Normal mood and affect.

## 2014-07-18 NOTE — Patient Instructions (Signed)
We have scheduled you for a flexible sigmoidoscopy and hemorrhoid banding with Dr. Oneida Alar.   I have sent in the cream to the pharmacy to use twice a day.

## 2014-07-19 ENCOUNTER — Other Ambulatory Visit: Payer: Self-pay

## 2014-07-19 DIAGNOSIS — K648 Other hemorrhoids: Secondary | ICD-10-CM

## 2014-07-23 NOTE — Assessment & Plan Note (Signed)
79 year old with chronic hemorrhoids, no prior colonoscopy, affecting quality of life. Small prolapsing internal hemorrhoids on physical exam, no evidence of thrombosis. Small volume hematochezia likely benign in nature; declining colonoscopy but agreeable to flexible sigmoidoscopy with possible banding at time of procedure. Continue high fiber diet. Refilled hemorrhoid cream. Avoid straining.   Proceed with flex sig and likely banding with Dr. Oneida Alar in the near future. The risks, benefits, and alternatives have been discussed in detail with the patient. They state understanding and desire to proceed.

## 2014-07-24 NOTE — Progress Notes (Signed)
PT DECLINED TCS. FSIG BANDING. REVIEWED-NO ADDITIONAL RECOMMENDATIONS.

## 2014-07-26 NOTE — Progress Notes (Signed)
cc'ed to pcp °

## 2014-07-27 ENCOUNTER — Telehealth: Payer: Self-pay | Admitting: Nurse Practitioner

## 2014-07-27 ENCOUNTER — Telehealth: Payer: Self-pay | Admitting: Gastroenterology

## 2014-07-27 NOTE — Telephone Encounter (Signed)
Someone called for the patient to let us know that she has a stomach virus and to cancel the procedure with [facility] for tomorrow. She will call back after the first of the year to get rescheduled.

## 2014-07-27 NOTE — Telephone Encounter (Signed)
Erin Mckay, family member, calling to request appt with Dr. Denman George in January to follow up on 04/28/14 surgery. Erin Mckay informed per Dr. Serita Grit last note that patient may f/u with Dr. Glo Herring in [place] if they wish. Erin Mckay states the patient would prefer to see Dr. Denman George. Patient then scheduled for f/u 08/28/13 @ 2:45 pm. Erin Mckay informed and verbalizes understanding of appointment date and time.

## 2014-07-27 NOTE — Telephone Encounter (Signed)
Called [facility]

## 2014-07-28 ENCOUNTER — Encounter (HOSPITAL_COMMUNITY): Admission: RE | Payer: Self-pay | Source: Ambulatory Visit

## 2014-07-28 ENCOUNTER — Ambulatory Visit (HOSPITAL_COMMUNITY): Admission: RE | Admit: 2014-07-28 | Payer: Medicare Other | Source: Ambulatory Visit | Admitting: Gastroenterology

## 2014-07-28 SURGERY — SIGMOIDOSCOPY, FLEXIBLE
Anesthesia: Moderate Sedation

## 2014-08-02 ENCOUNTER — Telehealth: Payer: Self-pay | Admitting: Gastroenterology

## 2014-08-02 NOTE — Telephone Encounter (Signed)
Velva Harman (daughter) called to get patient rescheduled for her flex sig and banding. She had to cancel before because she had a stomach virus and is ready now to get that taken care of. Please call [phone]

## 2014-08-03 ENCOUNTER — Other Ambulatory Visit: Payer: Self-pay

## 2014-08-03 DIAGNOSIS — K648 Other hemorrhoids: Secondary | ICD-10-CM

## 2014-08-03 NOTE — Telephone Encounter (Signed)
Called pt to reschedule procedures. No answer and unable to leave message.

## 2014-08-04 ENCOUNTER — Telehealth: Payer: Self-pay

## 2014-08-04 NOTE — Telephone Encounter (Signed)
Daughter called this am. Confirmed arrival time of 1230pm on the day of her procedure. Daughter is ok with time change.

## 2014-08-04 NOTE — Telephone Encounter (Signed)
Patients daughter is aware of new date and time. See phone note from 08/04/14

## 2014-08-06 ENCOUNTER — Emergency Department (HOSPITAL_COMMUNITY)
Admission: EM | Admit: 2014-08-06 | Discharge: 2014-08-06 | Disposition: A | Discharge status: Home / Self Care | Payer: Medicare Other | Attending: Emergency Medicine | Admitting: Emergency Medicine

## 2014-08-06 ENCOUNTER — Encounter (HOSPITAL_COMMUNITY): Payer: Self-pay | Admitting: Emergency Medicine

## 2014-08-06 DIAGNOSIS — Z720 Tobacco use: Secondary | ICD-10-CM | POA: Insufficient documentation

## 2014-08-06 DIAGNOSIS — Z79899 Other long term (current) drug therapy: Secondary | ICD-10-CM | POA: Diagnosis not present

## 2014-08-06 DIAGNOSIS — H547 Unspecified visual loss: Secondary | ICD-10-CM | POA: Insufficient documentation

## 2014-08-06 DIAGNOSIS — Z8540 Personal history of malignant neoplasm of unspecified female genital organ: Secondary | ICD-10-CM | POA: Insufficient documentation

## 2014-08-06 DIAGNOSIS — Z7952 Long term (current) use of systemic steroids: Secondary | ICD-10-CM | POA: Diagnosis not present

## 2014-08-06 DIAGNOSIS — Z87440 Personal history of urinary (tract) infections: Secondary | ICD-10-CM | POA: Diagnosis not present

## 2014-08-06 DIAGNOSIS — K649 Unspecified hemorrhoids: Secondary | ICD-10-CM

## 2014-08-06 DIAGNOSIS — Z8619 Personal history of other infectious and parasitic diseases: Secondary | ICD-10-CM | POA: Diagnosis not present

## 2014-08-06 DIAGNOSIS — K644 Residual hemorrhoidal skin tags: Secondary | ICD-10-CM | POA: Insufficient documentation

## 2014-08-06 DIAGNOSIS — I10 Essential (primary) hypertension: Secondary | ICD-10-CM | POA: Diagnosis not present

## 2014-08-06 DIAGNOSIS — H9193 Unspecified hearing loss, bilateral: Secondary | ICD-10-CM | POA: Insufficient documentation

## 2014-08-06 NOTE — Discharge Instructions (Signed)
°Emergency Department Resource Guide °1) Find a Doctor and Pay Out of Pocket °Although you won't have to find out who is covered by your insurance plan, it is a good idea to ask around and get recommendations. You will then need to call the office and see if the doctor you have chosen will accept you as a new patient and what types of options they offer for patients who are self-pay. Some doctors offer discounts or will set up payment plans for their patients who do not have insurance, but you will need to ask so you aren't surprised when you get to your appointment. ° °2) Contact Your Local Health Department °Not all health departments have doctors that can see patients for sick visits, but many do, so it is worth a call to see if yours does. If you don't know where your local health department is, you can check in your phone book. The CDC also has a tool to help you locate your state's health department, and many state websites also have listings of all of their local health departments. ° °3) Find a Walk-in Clinic °If your illness is not likely to be very severe or complicated, you may want to try a walk in clinic. These are popping up all over the country in pharmacies, drugstores, and shopping centers. They're usually staffed by nurse practitioners or physician assistants that have been trained to treat common illnesses and complaints. They're usually fairly quick and inexpensive. However, if you have serious medical issues or chronic medical problems, these are probably not your best option. ° °No Primary Care Doctor: °- Call [facility] at  832-8000 - they can help you locate a primary care doctor that  accepts your insurance, provides certain services, etc. °- Physician Referral Service- 1-800-533-3463 ° °Chronic Pain Problems: °Organization         Address  Phone   Notes  ° [facility]  (336) 297-2271 Patients need to be referred by their primary care doctor.  ° °Medication  Assistance: °Organization         Address  Phone   Notes  °[place] [facility] [address] °[place], [place] [address] (336) 641-8030 --Must be a resident of [place] °-- Must have NO insurance coverage whatsoever (no Medicaid/ Medicare, etc.) °-- The pt. MUST have a primary care doctor that directs their care regularly and follows them in the community °  °[facility]  (866) 331-1348   °[employer]  (888) 892-1162   ° °Agencies that provide inexpensive medical care: °Organization         Address  Phone   Notes  °[facility]  (336) 832-8035   °[facility]    (336) 832-7272   °[facility] [address] °[place], [place] [address] (336) 832-4777   °[facility] [address], °[place] (336) 271-4999   °[facility]    (336) 373-0678   °[facility]    (336) 272-1050   °[facility] ° [address], [place] Phone:  (336) 832-4444, Fax:  (336) 832-4440 Hours of Operation:  9 am - 6 pm, M-F.  Also accepts Medicaid/Medicare and self-pay.  °[facility] ° [address],  Phone: (336) 832-3150, Fax: (336) 832-3151. Hours of Operation:  8:30 am - 5:30 pm, M-F.  Also accepts Medicaid and self-pay.  °[facility] [place] [address], [place] Phone: (336) 878-6027   °[facility] [address], [place], [place] (336)723-1848, Ext. 123 Mondays & Thursdays: 7-9 AM.  First 15 patients are seen on a first come, first serve basis. °  ° °Medicaid-accepting [place] Providers: ° °Organization         Address  Phone   Notes  °[facility] [address], [place] (336) 641-2100 Also accepts self-pay patients.  °[facility] [address], [place] ° (336) 856-9996   °[facility] [address], [place]  (336) 288-8857   °[facility] [address] [place] [address], [place] (336) 299-7000   °Veita Bland [address], [place]  ° (336) 373-1557 Only accepts [place] Access Medicaid patients after they have their name applied to their card.  ° °Self-Pay (no insurance) in [place]: ° °Organization         Address  Phone   Notes  °Sickle Cell Patients, [facility] [address], [place] (336) 832-1970   °[facility] [address], [place] (336) 832-4400   °[facility] [place] ° [address],  (336) 992-4800   °[facility]/Dr. Osei-Bonsu ° [address] [place] [address], [place] or [address], [place] (336) 841-8500 Phone number for both [place] and [place] locations is the same.  °[facility] [address], [place] (336) 299-0000   °[facility] [address] [place] [address], [place] or [address] (336) 852-7530 °(336) 878-2260   °[facility] [address], [place] (336) 350-1642, phone; (336) 294-5005, fax Sees patients 1st and 3rd Saturday of every month.  Must not qualify for public or private insurance (i.e. Medicaid, Medicare, [place] Health Choice, Veterans' Benefits) • Household income should be no more than 200% of the poverty level •The clinic cannot treat you if you are pregnant or think you are pregnant • Sexually transmitted diseases are not treated at the clinic.  ° ° °Dental Care: °Organization         Address  Phone  Notes  °[place] [facility] [address], [place] (336) 641-6152 Accepts children up to age 21 who are enrolled in Medicaid or [place] Health Choice; pregnant women with a Medicaid card; and children who have applied for Medicaid or [place] Health Choice, but were declined, whose parents can pay a reduced fee at time of service.  °[place]  [facility] [place]  [address], [place] (336) 641-7733 Accepts children up to age 21 who are enrolled in Medicaid or [place] Health Choice; pregnant women with a Medicaid card; and children who have applied for Medicaid or [place] Health Choice, but were declined, whose parents can pay a reduced fee at time of service.  °[facility] ° [address],  (336) 641-4533 Patients are seen by appointment only. Walk-ins are not accepted. [facility] will see patients 18 years of age and older. °Monday - Tuesday (8am-5pm) °Most Wednesdays (8:30-5pm) °$30 per visit, cash only  °[facility] ° [address], [place] (336) 641-4533 Patients are seen by appointment only. Walk-ins are not accepted. [facility] will see patients 18 years of age and older. °One   Wednesday Evening (Monthly: Volunteer Based).  $30 per visit, cash only  °[facility]  (919) 537-3737 for adults; Children under age 4, call Graduate Pediatric Dentistry at ([phone]) [phone]. Children aged 4-14, please call ([phone]) [phone] to request a pediatric application. ° Dental services are provided in all areas of dental care including fillings, crowns and bridges, complete and partial dentures, implants, gum treatment, root canals, and extractions. Preventive care is also provided. Treatment is provided to both adults and children. °Patients are selected via a lottery and there is often a waiting list. °  °[facility] [address], °[place] ° (336) 763-8833 [email or web address].[email or web address] °  °[facility] [address], [place], [place] (336)723-1848, Ext. 123 Second and Fourth Thursday of each month, opens at 6:30 AM; Clinic ends at 9 AM.  Patients are seen on a first-come first-served basis, and a limited number are seen during each clinic.  ° °[facility] ° [address], [place], [place] (336) 723-7904    Eligibility Requirements °You must have lived in [place], [place], or [place] counties for at least the last three months. °  You cannot be eligible for state or federal sponsored healthcare insurance, including Veterans Administration, Medicaid, or Medicare. °  You generally cannot be eligible for healthcare insurance through your employer.  °  How to apply: °Eligibility screenings are held every Tuesday and Wednesday afternoon from 1:00 pm until 4:00 pm. You do not need an appointment for the interview!  °[facility] [address], [place], [place] 336-631-2330   °[facility]  336-342-8273   °[facility]  336-703-3100   °[facility]  336-570-6415   ° °Behavioral Health Resources in the Community: °Intensive Outpatient Programs °Organization         Address  Phone  Notes  °[facility] [address], [place], [place] 336-878-6098   °[facility] [address], [place], [place] 336-832-9800   °[facility] [address], [place], [place] ° 336-882-2125   °[facility] [address],  °[place], [place] 1-800-853-5163 or 336-641-4981   °Substance Abuse Resources °Organization         Address  Phone  Notes  °[facility]  336-882-2125   °[facility]  336-784-9470   °[facility]  336-285-9073   °[facility]  336-845-3988   °Residential & Outpatient Substance Abuse Program  1-800-659-3381   °Psychological Services °Organization         Address  Phone  Notes  °[facility]  336- 832-9600   °[facility]  336- 378-7881   °[facility] [address], [place] 1-800-853-5163 or 336-641-4981   ° °Mobile Crisis Teams °Organization         Address  Phone  Notes  °[facility], Mobile Crisis Care Unit  1-877-626-1772   °Assertive °[facility] ° [address]  [place], [place] 336-834-9664   °Sharon DeEsch [address] °[place] 336-554-5454   ° °Self-Help/Support Groups °Organization         Address  Phone             Notes  °[facility] [place] - variety of support groups  336- 373-1402 Call for more information  °Narcotics Anonymous (NA), [facility] [address], °[place]   2 meetings at this location  ° °  Residential Treatment Programs Organization         Address  Phone  Notes  [facility] [address]    [place]  1-3010453491   [facility]  [address] [place] [address], [place], [place]   [place], [place] 724-030-6182 Admissions: 8am-3pm M-F  [facility] [place] [address],    [place], [place] [phone]   [facility] [address] [place], [place], [place]   [facility] [address],  [place], [place]   [facility] Intensive Outpatient [address], [place], [place]   [facility]  [place], [place] 1-660-215-5792 or 717-626-9812   [facility] [address] [place], [place] Accepts Medicaid  [facility] [address],  [place] 1-(912) 229-8112 Substance Abuse/Addiction Treatment   [facility]         Address  Phone  Notes  [facility]  318-619-5051   Domenic Schwab, PhD [address] [place], [place]   561-814-7521 or (231)272-1309   [place], [place] [phone]   [facility] [address], [place], [place] (321)437-2998 Insurance/Medicaid/sponsorship through [facility] [address] [place]                                    [place], [place] [phone] [employer] [place] [address], [place] [phone]) [phone]    Dr. Adele Schilder  [phone]   [facility] Dept. 1) [address] [employer] 2) [place] 3)  [address], [place] [phone] [phone]) [phone]  (832)[phone]   [place] 325-217-4368 or (670)[phone] (After Hours)      Continue to take your usual prescriptions as previously directed.  Begin to take over the counter fiber products (ie: Citucel, Metamucil) or stool softener (miralax, colace), as directed on packaging, for the next month.  Use over the counter hemorrhoidal relief products (ie:  preparation H, anusol), as directed on packaging, as needed for symptoms.  Continue to sit in a warm water tub several times per day for the next week.  Call your regular GI doctor tomorrow to confirm your previously scheduled follow up appointment on Tuesday. Return to the Emergency Department immediately if worsening.

## 2014-08-06 NOTE — ED Notes (Signed)
Patient c/o pain from hemorrhoids. Denies any bleeding. Patient to have hemorrhoids banded on Tuesday. Patient requesting something for pain until Tuesday. Per family creams and wipes are not helping.

## 2014-08-06 NOTE — ED Provider Notes (Signed)
CSN: [id number]     Arrival date & time 08/06/14  1638 History   First MD Initiated Contact with Patient 08/06/14 1659     Chief Complaint  Patient presents with  . Hemorrhoids      HPI Pt was seen at 1725. Per pt and her family, c/o gradual onset and persistence of constant acute flair of her chronic "hemorrhoids" for the past 2 to 3 months. States they "pop out" and cause "buring" and "throbbing" pain. Pt states she was evaluated by her GI Dr. Oneida Alar several times for same, with last evaluation 2 weeks ago. Pt is scheduled for a flexible sigmoidoscopy in 2 days for banding of her hemorrhoids. Pt refuses to take stool softeners or any other meds besides BC powders for her discomfort. States she has been alternating "ice baths and warm baths," as well as using the "special cream" her GI MD rx for her at her last visit. Pt's family states they have taken pt to several local hospitals for this same complaint "and they really don't say anything different than what the GI doctor said." Denies rectal bleeding/discharge, no rectal injury, no abd pain, no fevers, no rash.     Past Medical History  Diagnosis Date  . Hypertension   . Frequent UTI   . Hard of hearing   . Vision loss, bilateral   . Measles   . Vulvar cancer 02/09/14  . Cataract     03-20-14 right eye" afraid to have surgery"  . Hemorrhoids     remains a problem.  . Vulvar cancer 03/28/14   Past Surgical History  Procedure Laterality Date  . Tubal ligation    . Appendectomy    . Eye surgery Left     cataract removal and was blinded.  . Cataract extraction Left     "blinded" s/p cataract surgery, cataract on left -"afraid to have surgery"  . Vulvectomy Right 03/28/2014    Procedure: WIDE LOCAL EXCISION OF RIGHT VULVAR;  Surgeon: Everitt Amber, MD;  Location: [facility];  Service: Gynecology;  Laterality: Right;   Family History  Problem Relation Age of Onset  . Heart disease Mother   . Early death Father   . Anuerysm Sister    stomach  . Cancer Brother   . Hypertension Daughter   . Hyperlipidemia Daughter   . Cancer Son     bladder  . Heart disease Brother   . Hypertension Son   . Hypertension Son   . Hypertension Daughter   . Colon cancer Neg Hx    History  Substance Use Topics  . Smoking status: Current Every Day Smoker -- 0.50 packs/day for 48 years    Types: Cigarettes  . Smokeless tobacco: Former Systems developer    Types: Chew  . Alcohol Use: No   OB History    Gravida Para Term Preterm AB TAB SAB Ectopic Multiple Living   5 5 5       4      Review of Systems ROS: Statement: All systems negative except as marked or noted in the HPI; Constitutional: Negative for fever and chills. ; ; Eyes: Negative for eye pain, redness and discharge. ; ; ENMT: Negative for ear pain, hoarseness, nasal congestion, sinus pressure and sore throat. ; ; Cardiovascular: Negative for chest pain, palpitations, diaphoresis, dyspnea and peripheral edema. ; ; Respiratory: Negative for cough, wheezing and stridor. ; ; Gastrointestinal: +hemorrhoids. Negative for nausea, vomiting, diarrhea, abdominal pain, blood in stool, hematemesis, jaundice and rectal bleeding. . ; ;  Genitourinary: Negative for dysuria, flank pain and hematuria. ; ; Musculoskeletal: Negative for back pain and neck pain. Negative for swelling and trauma.; ; Skin: Negative for pruritus, rash, abrasions, blisters, bruising and skin lesion.; ; Neuro: Negative for headache, lightheadedness and neck stiffness. Negative for weakness, altered level of consciousness , altered mental status, extremity weakness, paresthesias, involuntary movement, seizure and syncope.     Allergies  Pine[place] and Streptomycin  Home Medications   Prior to Admission medications   Medication Sig Start Date End Date Taking? Authorizing Provider  acetaminophen (TYLENOL) 500 MG tablet Take 1,000 mg by mouth every 6 (six) hours as needed for moderate pain.    Historical Provider, MD  hydrocortisone  (PROCTOZONE-HC) 2.5 % rectal cream Place 1 application rectally 2 (two) times daily. 07/18/14   Orvil Feil, NP  lisinopril (PRINIVIL,ZESTRIL) 40 MG tablet Take 40 mg by mouth at bedtime.  11/14/13   Historical Provider, MD   BP 199/87 mmHg  Pulse 78  Temp(Src) 98.9 F (37.2 C) (Oral)  Resp 18  Ht 5\' 1"  (1.549 m)  Wt 124 lb (56.246 kg)  BMI 23.44 kg/m2  SpO2 99% Physical Exam  1730: Physical examination:  Nursing notes reviewed; Vital signs and O2 SAT reviewed;  Constitutional: Well developed, Well nourished, Well hydrated, In no acute distress; Head:  Normocephalic, atraumatic; Eyes: EOMI, PERRL, No scleral icterus; ENMT: Mouth and pharynx normal, Mucous membranes moist; Neck: Supple, Full range of motion, No lymphadenopathy; Cardiovascular: Regular rate and rhythm, No gallop; Respiratory: Breath sounds clear & equal bilaterally, No rales, rhonchi, wheezes.  Speaking full sentences with ease, Normal respiratory effort/excursion; Chest: Nontender, Movement normal; Abdomen: Soft, Nontender, Nondistended, Normal bowel sounds. +external hemorrhoid without thrombosis or bleeding. No obvious internal hemorrhoid prolapse. Pt refused rectal exam.; Genitourinary: No CVA tenderness; Extremities: Pulses normal, No tenderness, No edema, No calf edema or asymmetry.; Neuro: AA&Ox3, +HOH, otherwise major CN grossly intact.  Speech clear. No gross focal motor or sensory deficits in extremities. Climbs on and off stretcher easily by herself. Gait steady.; Skin: Color normal, Warm, Dry.   ED Course  Procedures   MDM  MDM Reviewed: previous chart, nursing note and vitals      1740:  Long d/w pt and her family regarding tx for her hemorrhoids. Pt refuses to use stool softeners daily and "won't stop taking" BC powders for her discomfort. Pt is requesting "some stronger medicine" for pain. Explained that narcotic pain medication would be counterintuitive (cause constipation, therefore straining when stooling and  causing pain). Pt states "well I guess I'll go then." Pt strongly encouraged to keep her GI MD appt on Tuesday; verb understanding.   Francine Graven, DO 08/08/14 1022

## 2014-08-07 ENCOUNTER — Telehealth: Payer: Self-pay

## 2014-08-07 NOTE — Telephone Encounter (Signed)
Pt's daughter called this morning to make sure [facility] knows that her mother is to have her hemorrhoids banded with her Flex sig. I informed her that it was in her orders.

## 2014-08-08 ENCOUNTER — Ambulatory Visit (HOSPITAL_COMMUNITY)
Admission: RE | Admit: 2014-08-08 | Discharge: 2014-08-08 | Disposition: A | Discharge status: Home / Self Care | Payer: Medicare Other | Source: Ambulatory Visit | Attending: Gastroenterology | Admitting: Gastroenterology

## 2014-08-08 ENCOUNTER — Telehealth: Payer: Self-pay | Admitting: Gastroenterology

## 2014-08-08 ENCOUNTER — Encounter (HOSPITAL_COMMUNITY)
Admission: RE | Disposition: A | Discharge status: Home / Self Care | Payer: Self-pay | Source: Ambulatory Visit | Attending: Gastroenterology

## 2014-08-08 ENCOUNTER — Encounter (HOSPITAL_COMMUNITY): Payer: Self-pay | Admitting: *Deleted

## 2014-08-08 DIAGNOSIS — K642 Third degree hemorrhoids: Secondary | ICD-10-CM | POA: Insufficient documentation

## 2014-08-08 DIAGNOSIS — Z8589 Personal history of malignant neoplasm of other organs and systems: Secondary | ICD-10-CM | POA: Diagnosis not present

## 2014-08-08 DIAGNOSIS — Z881 Allergy status to other antibiotic agents status: Secondary | ICD-10-CM | POA: Insufficient documentation

## 2014-08-08 DIAGNOSIS — K921 Melena: Secondary | ICD-10-CM | POA: Insufficient documentation

## 2014-08-08 DIAGNOSIS — K5730 Diverticulosis of large intestine without perforation or abscess without bleeding: Secondary | ICD-10-CM | POA: Diagnosis not present

## 2014-08-08 DIAGNOSIS — I10 Essential (primary) hypertension: Secondary | ICD-10-CM | POA: Diagnosis not present

## 2014-08-08 DIAGNOSIS — Z79899 Other long term (current) drug therapy: Secondary | ICD-10-CM | POA: Insufficient documentation

## 2014-08-08 DIAGNOSIS — K648 Other hemorrhoids: Secondary | ICD-10-CM | POA: Insufficient documentation

## 2014-08-08 DIAGNOSIS — H269 Unspecified cataract: Secondary | ICD-10-CM | POA: Insufficient documentation

## 2014-08-08 DIAGNOSIS — Z91018 Allergy to other foods: Secondary | ICD-10-CM | POA: Insufficient documentation

## 2014-08-08 DIAGNOSIS — F17210 Nicotine dependence, cigarettes, uncomplicated: Secondary | ICD-10-CM | POA: Diagnosis not present

## 2014-08-08 DIAGNOSIS — K641 Second degree hemorrhoids: Secondary | ICD-10-CM | POA: Insufficient documentation

## 2014-08-08 DIAGNOSIS — H543 Unqualified visual loss, both eyes: Secondary | ICD-10-CM | POA: Diagnosis not present

## 2014-08-08 HISTORY — PX: HEMORRHOID BANDING: SHX5850

## 2014-08-08 HISTORY — PX: FLEXIBLE SIGMOIDOSCOPY: SHX5431

## 2014-08-08 SURGERY — SIGMOIDOSCOPY, FLEXIBLE
Anesthesia: Moderate Sedation

## 2014-08-08 MED ORDER — DOCUSATE SODIUM 100 MG PO CAPS: 100.0000 mg | ORAL_CAPSULE | Freq: Two times a day (BID) | ORAL | Status: DC

## 2014-08-08 MED ORDER — MIDAZOLAM HCL 5 MG/5ML IJ SOLN
INTRAMUSCULAR | Status: AC
  Filled 2014-08-08: qty 10

## 2014-08-08 MED ORDER — SODIUM CHLORIDE 0.9 % IV SOLN
INTRAVENOUS | Status: DC
  Administered 2014-08-08: 12:00:00 via INTRAVENOUS

## 2014-08-08 MED ORDER — MEPERIDINE HCL 100 MG/ML IJ SOLN
INTRAMUSCULAR | Status: DC
Start: 2014-08-08 — End: 2014-08-08
  Filled 2014-08-08: qty 2

## 2014-08-08 NOTE — Telephone Encounter (Signed)
REVIEWED-NO ADDITIONAL RECOMMENDATIONS. 

## 2014-08-08 NOTE — Discharge Instructions (Signed)
HEMORRHOIDAL BANDING DISCHARGE INSTRUCTIONS  I PUT BANDS IN 3 AREAS ABOVE YOUR HEMORRHOIDS. CALL [phone] FOR FOR RECTAL BLEEDING, FEVER, PAIN, OR DIFFICULTY URINATING. YOU MAY SEE BLEEDING FOR 3 DAYS TO 2 WEEKS.    HOME CARE INSTRUCTIONS  FOLLOWING YOUR PROCEDURE IT IS COMMON TO HAVE MINOR RECTAL DISCOMFORT OR PAIN. 1.  YOU may use ibuprofen 200 MG over-the-counter 1 every 6 hours as needed for mild rectal pain OR TYLENOL.   Take the ibuprofen with food and milk.  2   Use sitz bath 3 times a day  OR ANusol cream AS NEEDED FOR RECTAL PAIN.  3. Avoid straining to have bowel movements. 4. Keep anal area dry and clean.  5. Do not use a donut shaped pillow or sit on the toilet for long periods. This increases blood pooling and pain.  6. Move your bowels when your body has the urge; this will require less straining and will decrease pain and pressure.  7. Add Colace 100 mg twice daily FOR 7 DAYS to soften the stool. HOLD FOR DIARRHEA.        SIGMOIDOSCOPY Care After Read the instructions outlined below and refer to this sheet in the next week. These discharge instructions provide you with general information on caring for yourself after you leave the hospital. While your treatment has been planned according to the most current medical practices available, unavoidable complications occasionally occur. If you have any problems or questions after discharge, call DR. Sherlyn Ebbert, [phone].  ACTIVITY  You may resume your regular activity, but move at a slower pace for the next 24 hours.   Take frequent rest periods for the next 24 hours.   Walking will help get rid of the air and reduce the bloated feeling in your belly (abdomen).   No driving for 24 hours (because of the medicine (anesthesia) used during the test).   You may shower.   Do not sign any important legal documents or operate any machinery for 24 hours (because of the anesthesia used during the test).     NUTRITION  Drink plenty of fluids.   You may resume your normal diet as instructed by your doctor.   Begin with a light meal and progress to your normal diet. Heavy or fried foods are harder to digest and may make you feel sick to your stomach (nauseated).   Avoid alcoholic beverages for 24 hours or as instructed.    MEDICATIONS  You may resume your normal medications.   WHAT YOU CAN EXPECT TODAY  Some feelings of bloating in the abdomen.   Passage of more gas than usual.   Spotting of blood in your stool or on the toilet paper  .  IF YOU HAD POLYPS REMOVED DURING THE COLONOSCOPY:  Eat a soft diet IF YOU HAVE NAUSEA, BLOATING, ABDOMINAL PAIN, OR VOMITING.    FINDING OUT THE RESULTS OF YOUR TEST Not all test results are available during your visit. DR. Oneida Alar WILL CALL YOU WITHIN 7 DAYS OF YOUR PROCEDUE WITH YOUR RESULTS. Do not assume everything is normal if you have not heard from DR. Nikiah Goin IN ONE WEEK, CALL HER OFFICE AT [phone].  SEEK IMMEDIATE MEDICAL ATTENTION AND CALL THE OFFICE: [phone] IF:  You have more than a spotting of blood in your stool.   Your belly is swollen (abdominal distention).   You are nauseated or vomiting.   You have a temperature over 101F.   You have abdominal pain or discomfort that is severe or  gets worse throughout the day.    HEMORRHOIDAL BANDING COMPLICATIONS:  COMMON: 1. MINOR PAIN  UNCOMMON: 1. ABSCESS 2. BAND FALLS OFF 3. PROLAPSE OF HEMORRHOIDS AND PAIN 4. ULCER BLEEDING  A. USUALLY SELF-LIMITED: MAY LAST 3-5 DAYS  B. MAY REQUIRE INTERVENTION: 1-2 WEEKS AFTER INTERACTIONS 5. NECROTIZING PELVIC SEPSIS  A. SYMPTOMS: FEVER, PAIN, DIFFICULTY URINATING

## 2014-08-08 NOTE — H&P (Signed)
  Primary Care Physician:  Octavio Graves, DO Primary Gastroenterologist:  Dr. Oneida Alar  Pre-Procedure History & Physical: HPI:  Erin Mckay is a 79 y.o. female here for  BRBPR.   Past Medical History  Diagnosis Date  . Hypertension   . Frequent UTI   . Hard of hearing   . Vision loss, bilateral   . Measles   . Vulvar cancer 02/09/14  . Cataract     03-20-14 right eye" afraid to have surgery"  . Hemorrhoids     remains a problem.  . Vulvar cancer 03/28/14    Past Surgical History  Procedure Laterality Date  . Tubal ligation    . Appendectomy    . Eye surgery Left     cataract removal and was blinded.  . Cataract extraction Left     "blinded" s/p cataract surgery, cataract on left -"afraid to have surgery"  . Vulvectomy Right 03/28/2014    Procedure: WIDE LOCAL EXCISION OF RIGHT VULVAR;  Surgeon: Everitt Amber, MD;  Location: [facility];  Service: Gynecology;  Laterality: Right;    Prior to Admission medications   Medication Sig Start Date End Date Taking? Authorizing Provider  hydrocortisone (PROCTOZONE-HC) 2.5 % rectal cream Place 1 application rectally 2 (two) times daily. 07/18/14  Yes Orvil Feil, NP  lisinopril (PRINIVIL,ZESTRIL) 40 MG tablet Take 40 mg by mouth at bedtime.  11/14/13  Yes Historical Provider, MD    Allergies as of 08/03/2014 - Review Complete 07/26/2014  Allergen Reaction Noted  . Pineapple Swelling 03/17/2014  . Streptomycin  02/03/2014    Family History  Problem Relation Age of Onset  . Heart disease Mother   . Early death Father   . Anuerysm Sister     stomach  . Cancer Brother   . Hypertension Daughter   . Hyperlipidemia Daughter   . Cancer Son     bladder  . Heart disease Brother   . Hypertension Son   . Hypertension Son   . Hypertension Daughter   . Colon cancer Neg Hx     History   Social History  . Marital Status: Widowed    Spouse Name: N/A    Number of Children: N/A  . Years of Education: N/A   Occupational History  . Not  on file.   Social History Main Topics  . Smoking status: Current Every Day Smoker -- 0.50 packs/day for 48 years    Types: Cigarettes  . Smokeless tobacco: Former Systems developer    Types: Chew  . Alcohol Use: No  . Drug Use: No  . Sexual Activity: Not Currently    Birth Control/ Protection: Post-menopausal   Other Topics Concern  . Not on file   Social History Narrative    Review of Systems: See HPI, otherwise negative ROS   Physical Exam: BP 182/72 mmHg  Pulse 78  Temp(Src) 98 F (36.7 C) (Oral)  Resp 18  Ht 5\' 1"  (1.549 m)  Wt 124 lb (56.246 kg)  BMI 23.44 kg/m2  SpO2 98% General:   Alert,  pleasant and cooperative in NAD Head:  Normocephalic and atraumatic. Neck:  Supple; Lungs:  Clear throughout to auscultation.    Heart:  Regular rate and rhythm. Abdomen:  Soft, nontender and nondistended. Normal bowel sounds, without guarding, and without rebound.   Neurologic:  Alert and  oriented x4;  grossly normal neurologically.  Impression/Plan:   BRBPR  PLAN: FLEX SIG/?HEMORRHOID BANDING TODAY TODAY

## 2014-08-08 NOTE — Op Note (Signed)
[facility] [address] [place], [address]   FLEX SIGMOIDOSCOPY PROCEDURE REPORT  PATIENT: Erin Mckay, Erin Mckay  MR#: [id number] BIRTHDATE: 10-Jan-1924 , 82  yrs. old GENDER: female ENDOSCOPIST: Barney Drain, MD REFERRED CM:[id number] Melina Copa PROCEDURE DATE:  08-15-2014 PROCEDURE:   Sigmoidoscopy, diagnostic and Hemorrhoidectomy via banding, clips or ligation INDICATIONS:hematochezia.  HEMORRHOID DROP DOWN. MEDICATIONS: NONE  DESCRIPTION OF PROCEDURE:    Physical exam was performed.  Informed consent was obtained from the patient after explaining the benefits, risks, and alternatives to procedure.  The patient was connected to monitor and placed in left lateral position. Continuous oxygen was provided by nasal cannula and IV medicine administered through an indwelling cannula.  After administration of sedation and rectal exam, the patients rectum was intubated and the EC-[id number] ([id number])  colonoscope was advanced under direct visualization to the [place]. The scope was removed slowly by carefully examining the color, texture, anatomy, and integrity mucosa on the way out.  The patient was recovered in endoscopy and discharged home in satisfactory condition.    COLON FINDINGS: MODERATE HEMORRHOIDS(GRADE II-III).  3 BANDS APPLIED and There was mild diverticulosis noted in the sigmoid colon.    NO POLYPS.  PREP QUALITY: The overall prep quality was adequate.  COMPLICATIONS: None  ENDOSCOPIC IMPRESSION: 1.   MODERATE HEMORRHOIDS(GRADE II-III).  3 BANDS APPLIED 2.   Mild diverticulosis was noted in the sigmoid colon  RECOMMENDATIONS: 1.  May use ibuprofen 200 MG over-the-counter 1 every 6 hours as needed for mild rectal pain OR TYLENOL.   Take the ibuprofen with food and milk. 2  Use sitz bath 3 times a day OR ANusol cream AS NEEDED FOR RECTAL PAIN. 3.  Avoid straining to have bowel movements. 4.  Keep anal area dry and clean. 5.  Do not use a donut  shaped pillow or sit on the toilet for long periods.  This increases blood pooling and pain. 6.  Move your bowels when your body has the urge; this will require less straining and will decrease pain and pressure. 7.  Add Colace 100 mg twice daily FOR 7 DAYS to soften the stool. HOLD FOR DIARRHEA.    _______________________________ eSignedBarney Drain, MD Aug 15, 2014 7:14 PM   CPT CODES: ICD CODES:  The ICD and CPT codes recommended by this software are interpretations from the data that the clinical staff has captured with the software.  The verification of the translation of this report to the ICD and CPT codes and modifiers is the sole responsibility of the health care institution and practicing physician where this report was generated.  [facility]. will not be held responsible for the validity of the ICD and CPT codes included on this report.  AMA assumes no liability for data contained or not contained herein. CPT is a Designer, television/film set of the [employer].

## 2014-08-08 NOTE — Telephone Encounter (Signed)
PLEASE CALL PT. RX SENT PER PT REQUEST.

## 2014-08-09 NOTE — Telephone Encounter (Signed)
Pt is aware.  

## 2014-08-13 ENCOUNTER — Emergency Department (HOSPITAL_COMMUNITY)
Admission: EM | Admit: 2014-08-13 | Discharge: 2014-08-13 | Discharge status: Against Medical Advice | Payer: Medicare Other

## 2014-08-13 NOTE — ED Notes (Signed)
Repeat BP 187/74 Patient seen here 08/08/14, discharge BP at that time was 199/87. Per patients daughter pt has "white coat syndrome" , her baseline is "190's" and she has seen patients BP as high as "220"

## 2014-08-13 NOTE — ED Notes (Signed)
Just informed by patient and her daughter that patient is going to leave. I expressed concerned that  the patient should be checked out prior to leaving especuiallty since her BP was so high. Pts daughter  Stated they "will go to doctors office in the morning".

## 2014-08-13 NOTE — ED Notes (Signed)
Patient in status post hemmorid bands last week, c/o of progressively worsening pain. BP 205/82. [facility] triage nurse made aware, patient will be next to be triaged.

## 2014-08-14 ENCOUNTER — Encounter (HOSPITAL_COMMUNITY): Payer: Self-pay | Admitting: Gastroenterology

## 2014-08-15 ENCOUNTER — Telehealth: Payer: Self-pay

## 2014-08-15 MED ORDER — HYDROCORTISONE 2.5 % RE CREA: 1.0000 "application " | TOPICAL_CREAM | Freq: Two times a day (BID) | RECTAL | Status: DC

## 2014-08-15 MED ORDER — DILTIAZEM GEL 2 %: 1.0000 "application " | Freq: Three times a day (TID) | CUTANEOUS | Status: DC

## 2014-08-15 NOTE — Telephone Encounter (Signed)
Called and informed pt's daughter. She wants pt to be seen tomorrow. She said she could bring her anytime. Per Vicente Males, [place] to open up a slot for her to see pt tomorrow. Please call and let the pt know what time.

## 2014-08-15 NOTE — Telephone Encounter (Signed)
Pt's daughter, Velva Harman, called and said pt has been in a lot of pain with her hemorrhoids hanging out. She had some bandings at the hospital on 08/08/2014 at time of flex sig. She had some pain that day and then she had a day or so doing well. Then she started having pain after she would have a BM and be in a lot of pain. Per Velva Harman, pt is not constipated and she has a BM daily. But she gets in a lot of pain following the BM.  She has been doing sitz baths, Ibuprofen and it doesn't help. Velva Harman was begging for pt to be seen in the office today and I told her we do not have anything. She took pt to the ED on 08/13/2014 and the ED was so full of the flu she left because she was afraid her mom would get flu.  Please advise!

## 2014-08-15 NOTE — Telephone Encounter (Signed)
Sometimes when pain persists, could be dealing with an occult anal fissure. I am sending in anusol cream to use BID and alternate with diltiazem rectal gel three times a day.   For example: diltiazem gel morning, noon, evening. Anusol cream mid morning, mid afternoon.   Offer appt for Wed or Thursday to be seen.

## 2014-08-15 NOTE — Telephone Encounter (Signed)
APPOINTMENT MADE AND PATIENT DAUGHTER AWARE OF DATE AND TIME

## 2014-08-16 ENCOUNTER — Encounter: Payer: Self-pay | Admitting: Gastroenterology

## 2014-08-16 ENCOUNTER — Ambulatory Visit (INDEPENDENT_AMBULATORY_CARE_PROVIDER_SITE_OTHER): Payer: Medicare Other | Admitting: Gastroenterology

## 2014-08-16 VITALS — BP 166/79 | HR 83 | Temp 97.8°F | Ht 62.0 in | Wt 125.0 lb

## 2014-08-16 DIAGNOSIS — K648 Other hemorrhoids: Secondary | ICD-10-CM

## 2014-08-16 NOTE — Progress Notes (Signed)
Referring Provider: Octavio Graves, DO Primary Care Physician:  Octavio Graves, DO  Primary GI: Dr. Oneida Alar   Chief Complaint  Patient presents with  . Rectal Pain    HPI:   Erin Mckay is a 80 y.o. female presenting today with a history of internal hemorrhoids s/p flex sig on 12/22 revealing grade 2-3 hemorrhoids with banding X 3. Patient and daughter present today, with concerns of significant rectal pain.    Daughter states the hemorrhoids are still there. No pain because she hasn't had a BM. Last BM was 2 days ago. Pain only occurs with BM. Will have a good day with no pain at all then a terrible day. Woke up at 3am the other day hollering and screaming. Now using the hydrocortisone cream with some improvement. Doesn't hurt every time. States will come out from the inside and be really red, sometimes purple. Looks like turnips.   Past Medical History  Diagnosis Date  . Hypertension   . Frequent UTI   . Hard of hearing   . Vision loss, bilateral   . Measles   . Vulvar cancer 02/09/14  . Cataract     03-20-14 right eye" afraid to have surgery"  . Hemorrhoids     remains a problem.  . Vulvar cancer 03/28/14    Past Surgical History  Procedure Laterality Date  . Tubal ligation    . Appendectomy    . Eye surgery Left     cataract removal and was blinded.  . Cataract extraction Left     "blinded" s/p cataract surgery, cataract on left -"afraid to have surgery"  . Vulvectomy Right 03/28/2014    Procedure: WIDE LOCAL EXCISION OF RIGHT VULVAR;  Surgeon: Everitt Amber, MD;  Location: [facility];  Service: Gynecology;  Laterality: Right;  . Flexible sigmoidoscopy N/A 08/08/2014    Dr. Oneida Alar: moderate sized hemorrhoids Grade 2-3 with banding X 3. Mild diverticulosis in sigmoid colon  . Hemorrhoid banding N/A 08/08/2014    Procedure: HEMORRHOID BANDING;  Surgeon: Danie Binder, MD;  Location: [facility];  Service: Endoscopy;  Laterality: N/A;    Current Outpatient  Prescriptions  Medication Sig Dispense Refill  . acetaminophen (TYLENOL) 325 MG tablet Take 325 mg by mouth every 6 (six) hours as needed.    . diltiazem 2 % GEL Apply 1 application topically 3 (three) times daily. Apply three times a day to rectum for 2 weeks 30 g 1  . docusate sodium (COLACE) 100 MG capsule Take 1 capsule (100 mg total) by mouth 2 (two) times daily. 60 capsule 11  . hydrocortisone (ANUSOL-HC) 2.5 % rectal cream Place 1 application rectally 2 (two) times daily. 30 g 1  . ibuprofen (ADVIL,MOTRIN) 200 MG tablet Take 200 mg by mouth every 6 (six) hours as needed.    Marland Kitchen lisinopril (PRINIVIL,ZESTRIL) 40 MG tablet Take 40 mg by mouth at bedtime.      No current facility-administered medications for this visit.    Allergies as of 08/16/2014 - Review Complete 08/08/2014  Allergen Reaction Noted  . Pineapple Swelling 03/17/2014  . Streptomycin  02/03/2014    Family History  Problem Relation Age of Onset  . Heart disease Mother   . Early death Father   . Anuerysm Sister     stomach  . Cancer Brother   . Hypertension Daughter   . Hyperlipidemia Daughter   . Cancer Son     bladder  . Heart disease Brother   .  Hypertension Son   . Hypertension Son   . Hypertension Daughter   . Colon cancer Neg Hx     History   Social History  . Marital Status: Widowed    Spouse Name: N/A    Number of Children: N/A  . Years of Education: N/A   Social History Main Topics  . Smoking status: Current Every Day Smoker -- 0.50 packs/day for 48 years    Types: Cigarettes  . Smokeless tobacco: Former Systems developer    Types: Chew  . Alcohol Use: No  . Drug Use: No  . Sexual Activity: Not Currently    Birth Control/ Protection: Post-menopausal   Other Topics Concern  . None   Social History Narrative    Review of Systems: As mentioned in HPI  Physical Exam: BP 166/79 mmHg  Pulse 83  Temp(Src) 97.8 F (36.6 C) (Oral)  Ht 5\' 2"  (1.575 m)  Wt 125 lb (56.7 kg)  BMI 22.86  kg/m2 General:   Alert and oriented. No distress noted. Pleasant and cooperative.  Head:  Normocephalic and atraumatic. Rectal: 2 non-thrombosed external hemorrhoids, edema. DECLINED INTERNAL EXAM. No obvious anal fissure appreciated Msk:  kyphosis Extremities:  Without edema. Neurologic:  Alert and  oriented x4;  grossly normal neurologically. Psych:  Alert and cooperative. Normal mood and affect.

## 2014-08-16 NOTE — Patient Instructions (Signed)
Continue to take the cream twice a day per rectum.  Continue to stool softener twice a day (morning and night).  Start Miralax 1 capful each evening.  I would like you to see Dr. Oneida Alar next week.

## 2014-08-16 NOTE — Assessment & Plan Note (Signed)
Recent flex sig with banding of Grade II-III internal hemorrhoids X 3. Daughter notes still with intermittent prolapsing hemorrhoids per description; declining internal exam today. No obvious anal fissure on exam. Appears pain is intermittent, associated with BMs. Query occult anal fissure in addition to already known rather significant hemorrhoids. May need additional banding. Need to optimize bowel regimen. Start Miralax each evening in addition to colace BID. NO straining. Continue hydrocortisone cream BID. Attempted to have diltiazem gel filled at [facility] but not available; [place] quoted 50$ for diltiazem gel and nitro ointment. Unable to afford. Return in 1 week for consideration of additional banding.

## 2014-08-16 NOTE — Progress Notes (Signed)
cc'ed to pcp °

## 2014-08-22 ENCOUNTER — Telehealth: Payer: Self-pay | Admitting: Gastroenterology

## 2014-08-22 ENCOUNTER — Encounter (HOSPITAL_COMMUNITY)
Admission: RE | Disposition: A | Discharge status: Home / Self Care | Payer: Self-pay | Source: Ambulatory Visit | Attending: Gastroenterology

## 2014-08-22 ENCOUNTER — Telehealth: Payer: Self-pay

## 2014-08-22 ENCOUNTER — Encounter (HOSPITAL_COMMUNITY): Payer: Self-pay | Admitting: *Deleted

## 2014-08-22 ENCOUNTER — Ambulatory Visit (HOSPITAL_COMMUNITY)
Admission: RE | Admit: 2014-08-22 | Discharge: 2014-08-22 | Disposition: A | Discharge status: Home / Self Care | Payer: Medicare Other | Source: Ambulatory Visit | Attending: Gastroenterology | Admitting: Gastroenterology

## 2014-08-22 ENCOUNTER — Other Ambulatory Visit: Payer: Self-pay

## 2014-08-22 DIAGNOSIS — F17210 Nicotine dependence, cigarettes, uncomplicated: Secondary | ICD-10-CM | POA: Diagnosis not present

## 2014-08-22 DIAGNOSIS — K626 Ulcer of anus and rectum: Secondary | ICD-10-CM | POA: Insufficient documentation

## 2014-08-22 DIAGNOSIS — I10 Essential (primary) hypertension: Secondary | ICD-10-CM | POA: Insufficient documentation

## 2014-08-22 DIAGNOSIS — K6289 Other specified diseases of anus and rectum: Secondary | ICD-10-CM | POA: Insufficient documentation

## 2014-08-22 DIAGNOSIS — C519 Malignant neoplasm of vulva, unspecified: Secondary | ICD-10-CM

## 2014-08-22 HISTORY — DX: Other complications of anesthesia, initial encounter: T88.59XA

## 2014-08-22 HISTORY — DX: Adverse effect of unspecified anesthetic, initial encounter: T41.45XA

## 2014-08-22 HISTORY — PX: FLEXIBLE SIGMOIDOSCOPY: SHX5431

## 2014-08-22 SURGERY — SIGMOIDOSCOPY, FLEXIBLE
Anesthesia: Moderate Sedation

## 2014-08-22 MED ORDER — LIDOCAINE HCL 2 % EX GEL
CUTANEOUS | Status: AC
  Filled 2014-08-22: qty 30

## 2014-08-22 MED ORDER — LIDOCAINE HCL 2 % EX GEL
CUTANEOUS | Status: DC | PRN
  Administered 2014-08-22: 1 via TOPICAL

## 2014-08-22 MED ORDER — FLEET ENEMA 7-19 GM/118ML RE ENEM
1.0000 | ENEMA | Freq: Once | RECTAL | Status: AC
  Administered 2014-08-22: 1 via RECTAL
  Filled 2014-08-22: qty 1

## 2014-08-22 MED ORDER — ACETAMINOPHEN-CODEINE #3 300-30 MG PO TABS: ORAL_TABLET | ORAL | Status: DC

## 2014-08-22 MED ORDER — SODIUM CHLORIDE 0.9 % IV SOLN
INTRAVENOUS | Status: DC
  Administered 2014-08-22: 10:00:00 via INTRAVENOUS

## 2014-08-22 MED ORDER — MIDAZOLAM HCL 5 MG/5ML IJ SOLN
INTRAMUSCULAR | Status: AC
  Filled 2014-08-22: qty 5

## 2014-08-22 MED ORDER — HYDROCODONE-ACETAMINOPHEN 5-325 MG PO TABS: ORAL_TABLET | ORAL | Status: DC

## 2014-08-22 MED ORDER — MEPERIDINE HCL 50 MG/ML IJ SOLN
INTRAMUSCULAR | Status: AC
  Filled 2014-08-22: qty 1

## 2014-08-22 NOTE — Discharge Instructions (Signed)
FLEXIBLE SIGMOIDOSCOPY DISCHARGE INSTRUCTIONS  THE 3 AREAS WHERE I PLACED THE BANDS HAVE ULCERS. THEY USUALLY DO NOT CAUSE SIGNIFICANT PAIN AFTER 2 WEEKS. THEY WILL HEAL.   HOME CARE INSTRUCTIONS  1. USE Tylenol #3 1/2 TO 1 EVERY 6 HOURS TO CONTROL PAIN FOR THE NEXT 3 DAYS THEN USE AS NEEDED. IT MAY CAUSE CONSTIPATION. ADD MIRALAX TWICE DAILY FOR 3 DAYS WHILE TAKING VICODIN AROUND THE CLOCK. Add Colace 100 mg twice daily FOR 7 DAYS to soften the stool. HOLD FOR DIARRHEA.  2   Use SITZ bath 3 times a day AS NEEDED FOR RECTAL PAIN.  3. CONTINUE HYDROCORTISONE CREAM BID FOR ANOTHER 7 DAYS THEN STOP. 4. COMPLETE THE CT SCAN THIS WEEK.   5. Do not use a donut shaped pillow or sit on the toilet for long periods. This increases blood pooling and pain.   6. Move your bowels when your body has the urge; this will require less straining and will decrease pain and pressure.  7. CALL IN 5 DAYS IF YOUR PAIN IS NOT BETTER.  8. May use ibuprofen 200 MG over-the-counter 2 every 6 hours as needed for mild rectal pain.   Take the ibuprofen with food and milk.  9. USE CAUTION WHEN USING BC POWDERS. IT MAY CAUSE YOUR RECTUM TO BLEED.   SIGMOIDOSCOPY Care After Read the instructions outlined below and refer to this sheet in the next week. These discharge instructions provide you with general information on caring for yourself after you leave the hospital. While your treatment has been planned according to the most current medical practices available, unavoidable complications occasionally occur. If you have any problems or questions after discharge, call DR. Zanai Mallari, [phone].  ACTIVITY  You may resume your regular activity, but move at a slower pace for the next 24 hours.   Take frequent rest periods for the next 24 hours.   Walking will help get rid of the air and reduce the bloated feeling in your belly (abdomen).   No driving for 24 hours (because of the medicine (anesthesia) used during the test).    You may shower.   Do not sign any important legal documents or operate any machinery for 24 hours (because of the anesthesia used during the test).    NUTRITION  Drink plenty of fluids.   You may resume your normal diet as instructed by your doctor.   Begin with a light meal and progress to your normal diet. Heavy or fried foods are harder to digest and may make you feel sick to your stomach (nauseated).   Avoid alcoholic beverages for 24 hours or as instructed.    MEDICATIONS  You may resume your normal medications.   WHAT YOU CAN EXPECT TODAY  Some feelings of bloating in the abdomen.   Passage of more gas than usual.   Spotting of blood in your stool or on the toilet paper  .  IF YOU HAD POLYPS REMOVED DURING THE COLONOSCOPY:  Eat a soft diet IF YOU HAVE NAUSEA, BLOATING, ABDOMINAL PAIN, OR VOMITING.    FINDING OUT THE RESULTS OF YOUR TEST Not all test results are available during your visit. DR. Oneida Alar WILL CALL YOU WITHIN 7 DAYS OF YOUR PROCEDUE WITH YOUR RESULTS. Do not assume everything is normal if you have not heard from DR. Lavonne Cass IN ONE WEEK, CALL HER OFFICE AT [phone].  SEEK IMMEDIATE MEDICAL ATTENTION AND CALL THE OFFICE: [phone] IF:  You have more than a spotting of blood in  your stool.   Your belly is swollen (abdominal distention).   You are nauseated or vomiting.   You have a temperature over 101F.   You have abdominal pain or discomfort that is severe or gets worse throughout the day.

## 2014-08-22 NOTE — Telephone Encounter (Signed)
-----   Message from Danie Binder, MD sent at 08/22/2014 12:26 PM EST ----- PT NEEDS CT OF THE PELVIS W/ IV AND ORAL CONTRAST, Dx: HX-VULVAR CANCER, RECTAL PAIN, DIFFICULTY URINATING, S/P HEMORRHOID BANDING(DEC 22)

## 2014-08-22 NOTE — Progress Notes (Signed)
REVIEWED-NO ADDITIONAL RECOMMENDATIONS. 

## 2014-08-22 NOTE — H&P (View-Only) (Signed)
  Primary Care Physician:  Octavio Graves, DO Primary Gastroenterologist:  Dr. Oneida Alar  Pre-Procedure History & Physical: HPI:  Erin Mckay is a 81 y.o. female here for  BRBPR.   Past Medical History  Diagnosis Date  . Hypertension   . Frequent UTI   . Hard of hearing   . Vision loss, bilateral   . Measles   . Vulvar cancer 02/09/14  . Cataract     03-20-14 right eye" afraid to have surgery"  . Hemorrhoids     remains a problem.  . Vulvar cancer 03/28/14    Past Surgical History  Procedure Laterality Date  . Tubal ligation    . Appendectomy    . Eye surgery Left     cataract removal and was blinded.  . Cataract extraction Left     "blinded" s/p cataract surgery, cataract on left -"afraid to have surgery"  . Vulvectomy Right 03/28/2014    Procedure: WIDE LOCAL EXCISION OF RIGHT VULVAR;  Surgeon: Everitt Amber, MD;  Location: [facility];  Service: Gynecology;  Laterality: Right;    Prior to Admission medications   Medication Sig Start Date End Date Taking? Authorizing Provider  hydrocortisone (PROCTOZONE-HC) 2.5 % rectal cream Place 1 application rectally 2 (two) times daily. 07/18/14  Yes Orvil Feil, NP  lisinopril (PRINIVIL,ZESTRIL) 40 MG tablet Take 40 mg by mouth at bedtime.  11/14/13  Yes Historical Provider, MD    Allergies as of 08/03/2014 - Review Complete 07/26/2014  Allergen Reaction Noted  . Pineapple Swelling 03/17/2014  . Streptomycin  02/03/2014    Family History  Problem Relation Age of Onset  . Heart disease Mother   . Early death Father   . Anuerysm Sister     stomach  . Cancer Brother   . Hypertension Daughter   . Hyperlipidemia Daughter   . Cancer Son     bladder  . Heart disease Brother   . Hypertension Son   . Hypertension Son   . Hypertension Daughter   . Colon cancer Neg Hx     History   Social History  . Marital Status: Widowed    Spouse Mckay: N/A    Number of Children: N/A  . Years of Education: N/A   Occupational History  . Not  on file.   Social History Main Topics  . Smoking status: Current Every Day Smoker -- 0.50 packs/day for 48 years    Types: Cigarettes  . Smokeless tobacco: Former Systems developer    Types: Chew  . Alcohol Use: No  . Drug Use: No  . Sexual Activity: Not Currently    Birth Control/ Protection: Post-menopausal   Other Topics Concern  . Not on file   Social History Narrative    Review of Systems: See HPI, otherwise negative ROS   Physical Exam: BP 182/72 mmHg  Pulse 78  Temp(Src) 98 F (36.7 C) (Oral)  Resp 18  Ht 5\' 1"  (1.549 m)  Wt 124 lb (56.246 kg)  BMI 23.44 kg/m2  SpO2 98% General:   Alert,  pleasant and cooperative in NAD Head:  Normocephalic and atraumatic. Neck:  Supple; Lungs:  Clear throughout to auscultation.    Heart:  Regular rate and rhythm. Abdomen:  Soft, nontender and nondistended. Normal bowel sounds, without guarding, and without rebound.   Neurologic:  Alert and  oriented x4;  grossly normal neurologically.  Impression/Plan:   BRBPR  PLAN: FLEX SIG/?HEMORRHOID BANDING TODAY TODAY

## 2014-08-22 NOTE — Interval H&P Note (Signed)
History and Physical Interval Note:  08/22/2014 12:03 PM  Erin Mckay  has presented today for surgery, with the diagnosis of rectal pain  The various methods of treatment have been discussed with the patient and family. After consideration of risks, benefits and other options for treatment, the patient has consented to  Procedure(s) with comments: FLEXIBLE SIGMOIDOSCOPY (N/A) - 1115 as a surgical intervention .  The patient's history has been reviewed, patient examined, no change in status, stable for surgery.  I have reviewed the patient's chart and labs.  Questions were answered to the patient's satisfaction.     [employer]

## 2014-08-22 NOTE — Telephone Encounter (Signed)
Pt is schedule for Ct on 08/24/13 @ 9:15. Megan in [facility] Stay is aware and will let patients daughter know. Patient does not need a PA for CT

## 2014-08-22 NOTE — Telephone Encounter (Signed)
Pt up pacing floor. Has had rectal pain after TCS/BANDING. PT NEEDS FLEX SIG TODAY. INSTRUCTED DAUGHTER TO BRING PT TO SHORT STAY AT 1015A TODAY FOR CASE AT 1115A.

## 2014-08-22 NOTE — Telephone Encounter (Signed)
Orders in and patient is at short stay

## 2014-08-23 ENCOUNTER — Encounter: Payer: Medicare Other | Admitting: Gastroenterology

## 2014-08-23 ENCOUNTER — Other Ambulatory Visit: Payer: Self-pay

## 2014-08-23 DIAGNOSIS — C519 Malignant neoplasm of vulva, unspecified: Secondary | ICD-10-CM

## 2014-08-23 NOTE — Telephone Encounter (Signed)
Referral done

## 2014-08-23 NOTE — Telephone Encounter (Signed)
REFER PT TO ONCOLOGY FOR VULVAR CANCER FOR ? PALLIATIVE CHEMO. DISCUSSED WITH TOM KEFALAS.

## 2014-08-23 NOTE — Op Note (Signed)
[facility] [address] [place], [address]   FLEX SIGMOIDOSCOPY PROCEDURE REPORT  PATIENT: Erin Mckay, Erin Mckay  MR#: [id number] BIRTHDATE: 01-03-1924 , 43  yrs. old GENDER: female ENDOSCOPIST: Barney Drain, MD REFERRED MN:OTRRNHA Melina Copa PROCEDURE DATE:  08/22/2014 PROCEDURE:   Sigmoidoscopy, diagnostic INDICATIONS:RECTAL PAIN AFTER HEMORRHOID BANDING.  PMHx: VUVLAR CANCER.  UNABLE TO TOLERATE RADIATION.Marland Kitchen MEDICATIONS: NONE  DESCRIPTION OF PROCEDURE:    Physical exam was performed.  Informed consent was obtained from the patient after explaining the benefits, risks, and alternatives to procedure.  The patient was connected to monitor and placed in left lateral position. Continuous oxygen was provided by nasal cannula and IV medicine administered through an indwelling cannula.  After administration of sedation and rectal exam, the patients rectum was intubated and the EC-[id number] ([id number])  colonoscope was advanced under direct visualization to the [place].  The scope was removed slowly by carefully examining the color, texture, anatomy, and integrity mucosa on the way out.  The patient was recovered in endoscopy and discharged home in satisfactory condition.    COLON FINDINGS: Three small non-bleeding ulcers were found in the rectum.    PREP QUALITY: The overall prep quality was good.  COMPLICATIONS: None  ENDOSCOPIC IMPRESSION: Three small ulcers were found in the rectum DUE TO PRIOR HEMORRHOID BANDING  RECOMMENDATIONS: 1.  FAMILY REQUESTED PT USE Tylenol #3 1/2 TO 1 EVERY 6 HOURS TO CONTROL PAIN FOR THE NEXT 3 DAYS THEN USE AS NEEDED.  ADD MIRALAX TWICE DAILY FOR 3 DAYS WHILE TAKING VICODIN AROUND THE CLOCK.  Add Colace 100 mg twice daily FOR 7 DAYS to soften the stool.  HOLD FOR DIARRHEA. 2  Use SITZ bath 3 times a day AS NEEDED FOR RECTAL PAIN. 3.  CONTINUE HYDROCORTISONE CREAM BID FOR ANOTHER 7 DAYS THEN STOP. 4.  COMPLETE THE CT SCAN THIS  WEEK.  SEE ONCOLOGY IN 2 WEEKS. 5.  Do not use a donut shaped pillow or sit on the toilet for long periods.  This increases blood pooling and pain. 6.  Move your bowels when your body has the urge; this will require less straining and will decrease pain and pressure. 7.  CALL IN 5 DAYS IF YOUR PAIN IS NOT BETTER. 8.  May use ibuprofen 200 MG over-the-counter 2 every 6 hours as needed for mild rectal pain.   Take the ibuprofen with food and milk. 9.  USE CAUTION WHEN USING BC POWDERS.  IT MAY CAUSE RECTAL BLEEDING.    _______________________________ Lorrin MaisBarney Drain, MD 28-Aug-2014 1:38 PM   CPT CODES: ICD CODES:  The ICD and CPT codes recommended by this software are interpretations from the data that the clinical staff has captured with the software.  The verification of the translation of this report to the ICD and CPT codes and modifiers is the sole responsibility of the health care institution and practicing physician where this report was generated.  [employer] will not be held responsible for the validity of the ICD and CPT codes included on this report.  AMA assumes no liability for data contained or not contained herein. CPT is a Designer, television/film set of the [employer].

## 2014-08-23 NOTE — Telephone Encounter (Signed)
Pt's appointment with the [facility] is on 09/04/14

## 2014-08-24 ENCOUNTER — Encounter (HOSPITAL_COMMUNITY): Payer: Self-pay | Admitting: Gastroenterology

## 2014-08-24 ENCOUNTER — Telehealth: Payer: Self-pay

## 2014-08-24 ENCOUNTER — Ambulatory Visit (HOSPITAL_COMMUNITY): Payer: Medicare Other

## 2014-08-24 ENCOUNTER — Other Ambulatory Visit (HOSPITAL_COMMUNITY): Payer: Medicare Other

## 2014-08-24 NOTE — Telephone Encounter (Signed)
Pt does not want to see want to be seen at [facility]. She will continue to follow up in [place].

## 2014-08-24 NOTE — Telephone Encounter (Signed)
REVIEWED-NO ADDITIONAL RECOMMENDATIONS. 

## 2014-08-28 ENCOUNTER — Ambulatory Visit: Payer: Medicare Other | Attending: Gynecologic Oncology | Admitting: Gynecologic Oncology

## 2014-08-28 ENCOUNTER — Encounter: Payer: Self-pay | Admitting: Gynecologic Oncology

## 2014-08-28 VITALS — BP 189/79 | HR 80 | Temp 97.9°F | Resp 18 | Ht 62.0 in | Wt 126.3 lb

## 2014-08-28 DIAGNOSIS — Z780 Asymptomatic menopausal state: Secondary | ICD-10-CM | POA: Insufficient documentation

## 2014-08-28 DIAGNOSIS — K648 Other hemorrhoids: Secondary | ICD-10-CM

## 2014-08-28 DIAGNOSIS — K649 Unspecified hemorrhoids: Secondary | ICD-10-CM | POA: Insufficient documentation

## 2014-08-28 DIAGNOSIS — I10 Essential (primary) hypertension: Secondary | ICD-10-CM | POA: Insufficient documentation

## 2014-08-28 DIAGNOSIS — Z79899 Other long term (current) drug therapy: Secondary | ICD-10-CM | POA: Insufficient documentation

## 2014-08-28 DIAGNOSIS — C519 Malignant neoplasm of vulva, unspecified: Secondary | ICD-10-CM | POA: Insufficient documentation

## 2014-08-28 DIAGNOSIS — F17210 Nicotine dependence, cigarettes, uncomplicated: Secondary | ICD-10-CM | POA: Diagnosis not present

## 2014-08-28 DIAGNOSIS — K640 First degree hemorrhoids: Secondary | ICD-10-CM

## 2014-08-28 MED ORDER — HYDROCORTISONE 2.5 % RE CREA: 1.0000 "application " | TOPICAL_CREAM | Freq: Two times a day (BID) | RECTAL | Status: DC

## 2014-08-28 NOTE — Patient Instructions (Signed)
Followup with Dr. Denman George in 4 months. Please call us sooner with any issues or concerns.

## 2014-08-28 NOTE — Progress Notes (Signed)
OFFICE VISIT: Gyn Oncology  HPI:  Erin Mckay is a 81 y.o. year old initially seen in consultation on 01/28/14 for clinical stage IB vulvar SCC.  She and her family were counseled that optimal treatment of her condition would be a radical vulvectomy with lymphadenectomy. She and her family declined this as they felt strongly that she would not recover adequately from such a radical procedure and were interested in only minimal therapy for symptom relief. We discussed the option of primary radiation, however they declined this given their concerns about morbidity from skin burn. We elected to proceed with a palliative "toiletting" partial vulvectomy understanding that adequacy of margin status would be compromised, though this would likely be an easier procedure to recover from and she would achieve some relief of symptoms or irritation from the cancer.   She then underwent a simple partial right vulvectomy on 0/24/09 without complications.  Her postoperative course was uncomplicated.  Her final pathology revealed squamous cell carcinoma of the vulva, clinical stage IB. She had positive microscopic margins for invasive disease.  She elected for close followup only postop (rather than re-excision or radiation) as the goals to surgery were palliative only.   Interval Hx: She has been treated for painful symptomatic hemorrhoids. She requests anusol prescription for this. She is feeling much better from a vulvar cancer standpoint and denies burning or itch.  Allergies  Allergen Reactions  . Pineapple Swelling  . Streptomycin     Any mycin drugs, causes numbness all over body    Outpatient Encounter Prescriptions as of 08/28/2014  Medication Sig Note  . acetaminophen (TYLENOL) 325 MG tablet Take 325 mg by mouth every 6 (six) hours as needed.   Marland Kitchen acetaminophen-codeine (TYLENOL #3) 300-30 MG per tablet 1/2 to 1 po q6h for 3 days then as needed for rectal pain   . docusate sodium (COLACE) 100 MG  capsule Take 1 capsule (100 mg total) by mouth 2 (two) times daily.   . hydrocortisone (PROCTOZONE-HC) 2.5 % rectal cream Place 1 application rectally 2 (two) times daily.   Marland Kitchen lisinopril (PRINIVIL,ZESTRIL) 40 MG tablet Take 40 mg by mouth at bedtime.    . [DISCONTINUED] hydrocortisone (ANUSOL-HC) 2.5 % rectal cream Place 1 application rectally 2 (two) times daily.   Marland Kitchen ibuprofen (ADVIL,MOTRIN) 200 MG tablet Take 200 mg by mouth every 6 (six) hours as needed.   . [DISCONTINUED] ciprofloxacin (CIPRO) 500 MG tablet Take 500 mg by mouth 2 (two) times daily.  08/28/2014: Received from: External Pharmacy  . [DISCONTINUED] diltiazem 2 % GEL Apply 1 application topically 3 (three) times daily. Apply three times a day to rectum for 2 weeks    Past Medical History  Diagnosis Date  . Hypertension   . Frequent UTI   . Hard of hearing   . Vision loss, bilateral   . Measles   . Vulvar cancer 02/09/14  . Cataract     03-20-14 right eye" afraid to have surgery"  . Hemorrhoids     remains a problem.  . Vulvar cancer 03/28/14  . 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 Tubal ligation    . Appendectomy    . Eye surgery Left     cataract removal and was blinded.  . Cataract extraction Left     "blinded" s/p cataract surgery, cataract on left -"afraid to have surgery"  . Vulvectomy  Right 03/28/2014    Procedure: WIDE LOCAL EXCISION OF RIGHT VULVAR;  Surgeon: Everitt Amber, MD;  Location: [facility];  Service: Gynecology;  Laterality: Right;  . Flexible sigmoidoscopy N/A 08/08/2014    Dr. Oneida Alar: moderate sized hemorrhoids Grade 2-3 with banding X 3. Mild diverticulosis in sigmoid colon  . Hemorrhoid banding N/A 08/08/2014    Procedure: HEMORRHOID BANDING;  Surgeon: Danie Binder, MD;  Location: [facility];  Service: Endoscopy;  Laterality: N/A;  . Flexible sigmoidoscopy N/A 08/22/2014    Procedure: FLEXIBLE SIGMOIDOSCOPY;   Surgeon: Danie Binder, MD;  Location: [facility];  Service: Endoscopy;  Laterality: N/A;  15   Family History  Problem Relation Age of Onset  . Heart disease Mother   . Early death Father   . Anuerysm Sister     stomach  . Cancer Brother   . Hypertension Daughter   . Hyperlipidemia Daughter   . Cancer Son     bladder  . Heart disease Brother   . Hypertension Son   . Hypertension Son   . Hypertension Daughter   . Colon cancer Neg Hx    History   Social History  . Marital Status: Widowed    Spouse Name: N/A    Number of Children: N/A  . Years of Education: N/A   Social History Main Topics  . Smoking status: Current Every Day Smoker -- 0.50 packs/day for 48 years    Types: Cigarettes  . Smokeless tobacco: Former Systems developer    Types: Chew  . Alcohol Use: No  . Drug Use: No  . Sexual Activity: Not Currently    Birth Control/ Protection: Post-menopausal   Other Topics Concern  . None   Social History Narrative    Review of systems: Constitutional:  She has no weight gain or weight loss. She has no fever or chills. Eyes: No blurred vision Ears, Nose, Mouth, Throat: No dizziness, headaches or changes in hearing. No mouth sores. Cardiovascular: No chest pain, palpitations or edema. Respiratory:  No shortness of breath, wheezing or cough Gastrointestinal: She has normal bowel movements without diarrhea or constipation. She denies any nausea or vomiting. She denies blood in her stool or heart burn. Genitourinary:  See HPI Musculoskeletal: Denies muscle weakness or joint pains.  Skin:  She has no skin changes, rashes or itching Neurological:  Denies dizziness or headaches. No neuropathy, no numbness or tingling. Psychiatric:  She denies depression or anxiety. Hematologic/Lymphatic:   No easy bruising or bleeding  Physical Exam: Blood pressure 189/79, pulse 80, temperature 97.9 F (36.6 C), temperature source Oral, resp. rate 18, height 5\' 2"  (1.575 m), weight 126 lb  4.8 oz (57.289 kg). General: Well dressed, well nourished in no apparent distress.   HEENT:  Normocephalic and atraumatic, no lesions.  Extraocular muscles intact. Sclerae anicteric. Pupils equal, round, reactive. No mouth sores or ulcers. Thyroid is normal size, not nodular, midline. Skin:  No lesions or rashes. Breasts:  deferred. Lungs:  Clear to auscultation bilaterally.  No wheezes. Cardiovascular:  Regular rate and rhythm.  No murmurs or rubs. Abdomen:  Soft, nontender, nondistended.  No palpable masses.  No hepatosplenomegaly.  No ascites. Normal bowel sounds.  No hernias.   Genitourinary: right vulva incision healed well. No gross visible or palpable recurrent tumor. Circumferential beefy red erythema of vulva consistent with vulvar candidiasis. Extremities: No cyanosis, clubbing or edema.  No calf tenderness or erythema. No palpable cords. Psychiatric: Mood and affect are appropriate. Neurological: Awake, alert  and oriented x 3. Sensation is intact, no neuropathy.  Musculoskeletal: No pain, normal strength and range of motion.  Assessment:    81 y.o. year old with clinical stage IB vulvar SCC.   S/p palliative simple partial right vulvectomy on 03/28/14. Positive microscopic margins, no adjuvant therapy. NED on examination.  Plan: 1) Followup in 3 months 2) Counselled regarding symptoms of recurrence. 3) anusol Rx for hemorrhoids  Donaciano Eva, MD

## 2014-09-04 ENCOUNTER — Ambulatory Visit (HOSPITAL_COMMUNITY): Payer: Medicare Other | Admitting: Hematology & Oncology

## 2014-09-23 ENCOUNTER — Other Ambulatory Visit: Payer: Self-pay | Admitting: Gastroenterology

## 2014-09-25 ENCOUNTER — Other Ambulatory Visit: Payer: Self-pay

## 2014-09-25 ENCOUNTER — Telehealth: Payer: Self-pay | Admitting: Gastroenterology

## 2014-09-25 DIAGNOSIS — K640 First degree hemorrhoids: Secondary | ICD-10-CM

## 2014-09-25 MED ORDER — HYDROCORTISONE 2.5 % RE CREA: 1.0000 "application " | TOPICAL_CREAM | Freq: Two times a day (BID) | RECTAL | Status: DC

## 2014-09-25 MED ORDER — ACETAMINOPHEN-CODEINE #3 300-30 MG PO TABS: ORAL_TABLET | ORAL | Status: DC

## 2014-09-25 NOTE — Telephone Encounter (Signed)
Pt's daughter Velva Harman is aware the Rx for Tylenol with codeine is ready to pick up. Per Laban Emperor, NP, she is sending in the cream with refills to the pharmacy.

## 2014-09-25 NOTE — Telephone Encounter (Signed)
Spoke to pt's daughter. She said her mom is in a lot of pain. Said she goes through the cortisone cream in 2 weeks. Said Dr. Oneida Alar gave her prescription for Tylenol 3 for pain and she only has a couple left. Said the pharmacy said that refills were denied and she had her mom has to have something for the rectal discomfort. Please advise!

## 2014-09-25 NOTE — Telephone Encounter (Signed)
PATIENT DAUGHTER CALLED INQUIRING ON HER MOTHERS PRESCRIPTIONS AND WANTED TO KNOW IF REFILLS CAN BE PUT ON THE CREAM BECAUSE SHE IS GOING THROUGH IT EVERY 2 WEEKS  PLEASE ADVISE

## 2014-09-25 NOTE — Telephone Encounter (Signed)
Completed.

## 2014-09-25 NOTE — Addendum Note (Signed)
Addended by: Orvil Feil on: 09/25/2014 04:51 PM   Modules accepted: Orders

## 2014-09-25 NOTE — Addendum Note (Signed)
Addended by: Orvil Feil on: 09/25/2014 04:58 PM   Modules accepted: Orders

## 2014-09-29 NOTE — Telephone Encounter (Signed)
REVIEWED. PLEASE CALL PT'S DAUGHTER. IF HER MOTHER HAS CONTINUED RECTAL PAIN, SHE NEEDS TO COMPLETE THE CT OF THE PELVIS W/ IV AND ORAL CONTRAST, Dx: HX-VULVAR CANCER, RECTAL PAIN, DIFFICULTY URINATING, S/P HEMORRHOID BANDING(DEC 22). SHE SHOULD NOT HAVE PAIN THAT REQUIRES NARCOTICS THIS FAR OUT FROM HER HEMORRHOID BANDING.

## 2014-10-03 NOTE — Telephone Encounter (Signed)
LM for Erin Mckay to call.

## 2014-10-03 NOTE — Telephone Encounter (Signed)
I called and spoke to [facility]. She said they do not want pt to have CT. She said her mom is not able to tolerate surgery if it was needed. They are trying to keep her comfortable. She has the pain after having a BM daily. The Tylenol with codeine helps that.  She said they were told nothing could be done with the hemorrhoids on the outside and that is where her trouble is. She only gets one of the Tylenol with codeine daily, after her BM. She just hopes they can continue to get that monthly for her mom.

## 2014-10-03 NOTE — Telephone Encounter (Signed)
PLEASE CALL PT'S DAUGHTER. I UNDERSTAND. I WILL REFILL TYLENOL W/ CODEINE #30 PER MONTH TO HELP KEEP HER MOTHER COMFORTABLE.Marland Kitchen

## 2014-10-04 NOTE — Telephone Encounter (Signed)
REVIEWED-NO ADDITIONAL RECOMMENDATIONS. 

## 2014-10-04 NOTE — Telephone Encounter (Signed)
Erin Mckay is aware and is very appreciative to Dr. Oneida Alar.

## 2014-10-14 DIAGNOSIS — H2513 Age-related nuclear cataract, bilateral: Secondary | ICD-10-CM | POA: Diagnosis not present

## 2014-10-14 DIAGNOSIS — H40033 Anatomical narrow angle, bilateral: Secondary | ICD-10-CM | POA: Diagnosis not present

## 2014-10-30 ENCOUNTER — Other Ambulatory Visit: Payer: Self-pay | Admitting: Gastroenterology

## 2014-10-30 NOTE — Telephone Encounter (Signed)
PATIENT DAUGHTER, RITA HENSLEY, CALLED AND STATED THAT HER MOTHER IS NEEDING MORE TYLENOL WITH CODENE FOR PAIN.  PLEASE ADVISE [phone]

## 2014-10-30 NOTE — Telephone Encounter (Signed)
Erin Mckay is aware that Dr. Oneida Alar is off today. Her mom still has 5 -6 pills. She just wanted to let Dr. Oneida Alar know before the last min. Erin Mckay will be in town on [place] and was hoping that she could get the prescription then.

## 2014-10-31 ENCOUNTER — Telehealth: Payer: Self-pay | Admitting: Gastroenterology

## 2014-10-31 NOTE — Telephone Encounter (Signed)
Patient daughter called again inquiring about prescription, wanting to know if she can pick it up Wednesday please advise

## 2014-11-01 ENCOUNTER — Telehealth: Payer: Self-pay | Admitting: Gastroenterology

## 2014-11-01 MED ORDER — ACETAMINOPHEN-CODEINE #3 300-30 MG PO TABS: ORAL_TABLET | ORAL | Status: DC

## 2014-11-01 NOTE — Addendum Note (Signed)
Addended by: Danie Binder on: 11/01/2014 02:09 PM   Modules accepted: Orders

## 2014-11-01 NOTE — Telephone Encounter (Signed)
LMOM to call. Rx's are at front for pick up.

## 2014-11-01 NOTE — Telephone Encounter (Signed)
REVIEWED-NO ADDITIONAL RECOMMENDATIONS. 

## 2014-11-01 NOTE — Telephone Encounter (Signed)
Note sent to Dr. Oneida Alar on 10/30/2014.

## 2014-11-01 NOTE — Telephone Encounter (Signed)
Pt's daughter called to see if RX was ready. Said she will call back later.

## 2014-11-01 NOTE — Telephone Encounter (Signed)
PT's daughter is aware.  

## 2014-11-01 NOTE — Telephone Encounter (Signed)
PLEASE CALL PT'S DAUGHTER TO PICK UP RX. #15 Nov 2014, APR 2016, AND MAY 2016.

## 2014-12-25 ENCOUNTER — Encounter: Payer: Self-pay | Admitting: Gynecologic Oncology

## 2014-12-25 ENCOUNTER — Ambulatory Visit: Payer: Medicare Other | Attending: Gynecologic Oncology | Admitting: Gynecologic Oncology

## 2014-12-25 VITALS — BP 190/81 | HR 66 | Temp 98.5°F | Resp 18 | Ht 62.0 in | Wt 125.3 lb

## 2014-12-25 DIAGNOSIS — R229 Localized swelling, mass and lump, unspecified: Secondary | ICD-10-CM | POA: Diagnosis not present

## 2014-12-25 DIAGNOSIS — C519 Malignant neoplasm of vulva, unspecified: Secondary | ICD-10-CM | POA: Insufficient documentation

## 2014-12-25 MED ORDER — FLUCONAZOLE 150 MG PO TABS: 150.0000 mg | ORAL_TABLET | Freq: Every day | ORAL | Status: DC

## 2014-12-25 NOTE — Patient Instructions (Signed)
We will call with the biopsy results from today. Plan to followup with Dr. Denman George in 3 months as scheduled above or sooner depending on biopsy results.  You may notice some grey vaginal discharge after the biopsy today - this is normal. Please call us with any further questions or concerns.

## 2014-12-25 NOTE — Progress Notes (Signed)
OFFICE VISIT: Gyn Oncology  Assessment:    81 y.o. year old with clinical stage IB vulvar SCC.   S/p palliative simple partial right vulvectomy on 03/28/14. Positive microscopic margins, no adjuvant therapy at request of family. Posterior urethral nodule suspicious for recurrence on today's exam.  Plan: 1) Followup biopsy results 2) If recurrence - recommend radiation given its location in relation to urethral meatus/sphincter 3) If benign - recommend followup in 3 months. 4) diflucan 150mg  x 1 for presumed yeast (vulvovaginal)  HPI:  Erin Mckay is a 81 y.o. year old initially seen in consultation on 01/28/14 for clinical stage IB vulvar SCC.  She and her family were counseled that optimal treatment of her condition would be a radical vulvectomy with lymphadenectomy. She and her family declined this as they felt strongly that she would not recover adequately from such a radical procedure and were interested in only minimal therapy for symptom relief. We discussed the option of primary radiation, however they declined this given their concerns about morbidity from skin burn. We elected to proceed with a palliative "toiletting" partial vulvectomy understanding that adequacy of margin status would be compromised, though this would likely be an easier procedure to recover from and she would achieve some relief of symptoms or irritation from the cancer.   She then underwent a simple partial right vulvectomy on 4/33/29 without complications.  Her postoperative course was uncomplicated.  Her final pathology revealed squamous cell carcinoma of the vulva, clinical stage IB. She had positive microscopic margins for invasive disease.  She elected for close followup only postop (rather than re-excision or radiation) as the goals to surgery were palliative only.   Interval Hx: She has had increased vulvovaginal irritation since beginning to go on long walks. Her daughter believes her vulva is more beefy  red.  Allergies  Allergen Reactions  . Pineapple Swelling  . Streptomycin     Any mycin drugs, causes numbness all over body    Outpatient Encounter Prescriptions as of 12/25/2014  . Order #: [id number] Class: Print  . Order #: [id number] Class: Normal  . Order #: [id number] Class: Historical Med  . Order #: [id number] Class: Historical Med  . [DISCONTINUED] Order #: [id number] Class: Historical Med  . [DISCONTINUED] Order #: [id number] Class: Print  . [DISCONTINUED] Order #: [id number] Class: Normal  . [DISCONTINUED] Order #: [id number] Class: Normal   Past Medical History  Diagnosis Date  . Hypertension   . Frequent UTI   . Hard of hearing   . Vision loss, bilateral   . Measles   . Vulvar cancer 02/09/14  . Cataract     03-20-14 right eye" afraid to have surgery"  . Hemorrhoids     remains a problem.  . Vulvar cancer 03/28/14  . 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 Tubal ligation    . Appendectomy    . Eye surgery Left     cataract removal and was blinded.  . Cataract extraction Left     "blinded" s/p cataract surgery, cataract on left -"afraid to have surgery"  . Vulvectomy Right 03/28/2014    Procedure: WIDE LOCAL EXCISION OF RIGHT VULVAR;  Surgeon: Everitt Amber, MD;  Location: [facility];  Service: Gynecology;  Laterality: Right;  . Flexible sigmoidoscopy N/A 08/08/2014    Dr. Oneida Alar: moderate sized hemorrhoids Grade 2-3 with banding X 3. Mild diverticulosis  in sigmoid colon  . Hemorrhoid banding N/A 08/08/2014    Procedure: HEMORRHOID BANDING;  Surgeon: Danie Binder, MD;  [facility]: [facility];  Service: Endoscopy;  Laterality: N/A;  . Flexible sigmoidoscopy N/A 08/22/2014    Procedure: FLEXIBLE SIGMOIDOSCOPY;  Surgeon: Danie Binder, MD;  [facility]: [facility];  Service: Endoscopy;  Laterality: N/A;  65   Family History  Problem Relation Age of Onset  . Heart disease Mother    . Early death Father   . Anuerysm Sister     stomach  . Cancer Brother   . Hypertension Daughter   . Hyperlipidemia Daughter   . Cancer Son     bladder  . Heart disease Brother   . Hypertension Son   . Hypertension Son   . Hypertension Daughter   . Colon cancer Neg Hx    History   Social History  . Marital Status: Widowed    Spouse Name: N/A  . Number of Children: N/A  . Years of Education: N/A   Social History Main Topics  . Smoking status: Current Every Day Smoker -- 0.50 packs/day for 48 years    Types: Cigarettes  . Smokeless tobacco: Former Systems developer    Types: Chew  . Alcohol Use: No  . Drug Use: No  . Sexual Activity: Not Currently    Birth Control/ Protection: Post-menopausal   Other Topics Concern  . None   Social History Narrative    Review of systems: Constitutional:  She has no weight gain or weight loss. She has no fever or chills. Eyes: No blurred vision Ears, Nose, Mouth, Throat: No dizziness, headaches or changes in hearing. No mouth sores. Cardiovascular: No chest pain, palpitations or edema. Respiratory:  No shortness of breath, wheezing or cough Gastrointestinal: She has normal bowel movements without diarrhea or constipation. She denies any nausea or vomiting. She denies blood in her stool or heart burn. Genitourinary:  See HPI Musculoskeletal: Denies muscle weakness or joint pains.  Skin:  She has no skin changes, rashes or itching Neurological:  Denies dizziness or headaches. No neuropathy, no numbness or tingling. Psychiatric:  She denies depression or anxiety. Hematologic/Lymphatic:   No easy bruising or bleeding  Physical Exam: Blood pressure 190/81, pulse 66, temperature 98.5 F (36.9 C), temperature source Oral, resp. rate 18, height 5\' 2"  (1.575 m), weight 125 lb 4.8 oz (56.836 kg). General: Well dressed, well nourished in no apparent distress.   HEENT:  Normocephalic and atraumatic, no lesions.  Extraocular muscles intact. Sclerae  anicteric. Pupils equal, round, reactive. No mouth sores or ulcers. Thyroid is normal size, not nodular, midline. Skin:  No lesions or rashes. Breasts:  deferred. Lungs:  Clear to auscultation bilaterally.  No wheezes. Cardiovascular:  Regular rate and rhythm.  No murmurs or rubs. Abdomen:  Soft, nontender, nondistended.  No palpable masses.  No hepatosplenomegaly.  No ascites. Normal bowel sounds.  No hernias.   Genitourinary: right vulva incision healed well. 1cm firm fleshy nodule exophytic from posterior left urethral meatus. Circumferential beefy red erythema of vulva consistent with vulvar candidiasis. Extremities: No cyanosis, clubbing or edema.  No calf tenderness or erythema. No palpable cords. Psychiatric: Mood and affect are appropriate. Neurological: Awake, alert and oriented x 3. Sensation is intact, no neuropathy.  Musculoskeletal: No pain, normal strength and range of motion.   PROCEDURE NOTE: VULVAR BIOPSY Patient provided verbal consent. Urethral meatus prepped with betadine. Hurracaine ointment applied to meatus. 1cc of 1% lidocaine injected into urethral nodule.  Tischler forcep used to biopsy urethral nodule. Hemostasis obtained with silver nitrate.   Donaciano Eva, MD

## 2014-12-26 DIAGNOSIS — C680 Malignant neoplasm of urethra: Secondary | ICD-10-CM | POA: Diagnosis not present

## 2014-12-29 ENCOUNTER — Telehealth: Payer: Self-pay | Admitting: Gynecologic Oncology

## 2014-12-29 NOTE — Telephone Encounter (Signed)
Informed patient's daughter Erin Mckay that the biopsy from the urethral meatus returned as squamous cell carcinoma (recurrent vulvar cancer). Given its location I do not believe that it is resectable without her losing urinary continence. Additioanlly, without treatment, it will surely grow to obstruct her urethra. Therefore I am recommending radiation.  They are interested in seeing radiation oncology in [place].  Donaciano Eva, MD

## 2015-01-01 ENCOUNTER — Telehealth: Payer: Self-pay | Admitting: *Deleted

## 2015-01-01 NOTE — Telephone Encounter (Signed)
Per Dr. Denman George, patient and her family would like to see radiation oncology at [facility] in [place]. Voicemail left for rad onc department requesting return call so appt can be made and patient information can be faxed to their office. I called and spoke with patient's daughter, Velva Harman, to let her know we are in the process of making the referral and she will receive a phone call once an appointment has been scheduled for her mom.

## 2015-01-03 NOTE — Telephone Encounter (Signed)
Referral faxed to rad onc on Tuesday, 01/02/15.

## 2015-01-04 NOTE — Telephone Encounter (Signed)
Spoke with radiation oncology and confirmed they received patient records. Pt will see Dr. Sondra Come on Monday, 01/08/15.

## 2015-01-08 DIAGNOSIS — I10 Essential (primary) hypertension: Secondary | ICD-10-CM | POA: Diagnosis not present

## 2015-01-08 DIAGNOSIS — Z87440 Personal history of urinary (tract) infections: Secondary | ICD-10-CM | POA: Diagnosis not present

## 2015-01-08 DIAGNOSIS — H543 Unqualified visual loss, both eyes: Secondary | ICD-10-CM | POA: Diagnosis not present

## 2015-01-08 DIAGNOSIS — Z809 Family history of malignant neoplasm, unspecified: Secondary | ICD-10-CM | POA: Diagnosis not present

## 2015-01-08 DIAGNOSIS — Z881 Allergy status to other antibiotic agents status: Secondary | ICD-10-CM | POA: Diagnosis not present

## 2015-01-08 DIAGNOSIS — F17210 Nicotine dependence, cigarettes, uncomplicated: Secondary | ICD-10-CM | POA: Diagnosis not present

## 2015-01-08 DIAGNOSIS — C519 Malignant neoplasm of vulva, unspecified: Secondary | ICD-10-CM | POA: Diagnosis not present

## 2015-01-08 DIAGNOSIS — Z79899 Other long term (current) drug therapy: Secondary | ICD-10-CM | POA: Diagnosis not present

## 2015-01-08 DIAGNOSIS — Z8052 Family history of malignant neoplasm of bladder: Secondary | ICD-10-CM | POA: Diagnosis not present

## 2015-01-12 DIAGNOSIS — F17210 Nicotine dependence, cigarettes, uncomplicated: Secondary | ICD-10-CM | POA: Diagnosis not present

## 2015-01-12 DIAGNOSIS — Z87440 Personal history of urinary (tract) infections: Secondary | ICD-10-CM | POA: Diagnosis not present

## 2015-01-12 DIAGNOSIS — C519 Malignant neoplasm of vulva, unspecified: Secondary | ICD-10-CM | POA: Diagnosis not present

## 2015-01-12 DIAGNOSIS — I10 Essential (primary) hypertension: Secondary | ICD-10-CM | POA: Diagnosis not present

## 2015-01-12 DIAGNOSIS — Z79899 Other long term (current) drug therapy: Secondary | ICD-10-CM | POA: Diagnosis not present

## 2015-01-12 DIAGNOSIS — H543 Unqualified visual loss, both eyes: Secondary | ICD-10-CM | POA: Diagnosis not present

## 2015-01-16 DIAGNOSIS — H543 Unqualified visual loss, both eyes: Secondary | ICD-10-CM | POA: Diagnosis not present

## 2015-01-16 DIAGNOSIS — Z87440 Personal history of urinary (tract) infections: Secondary | ICD-10-CM | POA: Diagnosis not present

## 2015-01-16 DIAGNOSIS — C519 Malignant neoplasm of vulva, unspecified: Secondary | ICD-10-CM | POA: Diagnosis not present

## 2015-01-16 DIAGNOSIS — F17210 Nicotine dependence, cigarettes, uncomplicated: Secondary | ICD-10-CM | POA: Diagnosis not present

## 2015-01-16 DIAGNOSIS — I10 Essential (primary) hypertension: Secondary | ICD-10-CM | POA: Diagnosis not present

## 2015-01-16 DIAGNOSIS — Z79899 Other long term (current) drug therapy: Secondary | ICD-10-CM | POA: Diagnosis not present

## 2015-01-22 DIAGNOSIS — L598 Other specified disorders of the skin and subcutaneous tissue related to radiation: Secondary | ICD-10-CM | POA: Diagnosis not present

## 2015-01-22 DIAGNOSIS — Z510 Encounter for antineoplastic radiation therapy: Secondary | ICD-10-CM | POA: Diagnosis not present

## 2015-01-22 DIAGNOSIS — C519 Malignant neoplasm of vulva, unspecified: Secondary | ICD-10-CM | POA: Diagnosis not present

## 2015-01-23 DIAGNOSIS — L598 Other specified disorders of the skin and subcutaneous tissue related to radiation: Secondary | ICD-10-CM | POA: Diagnosis not present

## 2015-01-23 DIAGNOSIS — Z510 Encounter for antineoplastic radiation therapy: Secondary | ICD-10-CM | POA: Diagnosis not present

## 2015-01-23 DIAGNOSIS — C519 Malignant neoplasm of vulva, unspecified: Secondary | ICD-10-CM | POA: Diagnosis not present

## 2015-01-24 DIAGNOSIS — Z510 Encounter for antineoplastic radiation therapy: Secondary | ICD-10-CM | POA: Diagnosis not present

## 2015-01-24 DIAGNOSIS — C519 Malignant neoplasm of vulva, unspecified: Secondary | ICD-10-CM | POA: Diagnosis not present

## 2015-01-24 DIAGNOSIS — L598 Other specified disorders of the skin and subcutaneous tissue related to radiation: Secondary | ICD-10-CM | POA: Diagnosis not present

## 2015-01-25 DIAGNOSIS — Z510 Encounter for antineoplastic radiation therapy: Secondary | ICD-10-CM | POA: Diagnosis not present

## 2015-01-25 DIAGNOSIS — C519 Malignant neoplasm of vulva, unspecified: Secondary | ICD-10-CM | POA: Diagnosis not present

## 2015-01-25 DIAGNOSIS — L598 Other specified disorders of the skin and subcutaneous tissue related to radiation: Secondary | ICD-10-CM | POA: Diagnosis not present

## 2015-01-26 DIAGNOSIS — C519 Malignant neoplasm of vulva, unspecified: Secondary | ICD-10-CM | POA: Diagnosis not present

## 2015-01-26 DIAGNOSIS — L598 Other specified disorders of the skin and subcutaneous tissue related to radiation: Secondary | ICD-10-CM | POA: Diagnosis not present

## 2015-01-26 DIAGNOSIS — Z510 Encounter for antineoplastic radiation therapy: Secondary | ICD-10-CM | POA: Diagnosis not present

## 2015-01-29 DIAGNOSIS — L598 Other specified disorders of the skin and subcutaneous tissue related to radiation: Secondary | ICD-10-CM | POA: Diagnosis not present

## 2015-01-29 DIAGNOSIS — C519 Malignant neoplasm of vulva, unspecified: Secondary | ICD-10-CM | POA: Diagnosis not present

## 2015-01-29 DIAGNOSIS — Z510 Encounter for antineoplastic radiation therapy: Secondary | ICD-10-CM | POA: Diagnosis not present

## 2015-01-30 DIAGNOSIS — L598 Other specified disorders of the skin and subcutaneous tissue related to radiation: Secondary | ICD-10-CM | POA: Diagnosis not present

## 2015-01-30 DIAGNOSIS — Z510 Encounter for antineoplastic radiation therapy: Secondary | ICD-10-CM | POA: Diagnosis not present

## 2015-01-30 DIAGNOSIS — C519 Malignant neoplasm of vulva, unspecified: Secondary | ICD-10-CM | POA: Diagnosis not present

## 2015-01-31 DIAGNOSIS — L598 Other specified disorders of the skin and subcutaneous tissue related to radiation: Secondary | ICD-10-CM | POA: Diagnosis not present

## 2015-01-31 DIAGNOSIS — Z510 Encounter for antineoplastic radiation therapy: Secondary | ICD-10-CM | POA: Diagnosis not present

## 2015-01-31 DIAGNOSIS — C519 Malignant neoplasm of vulva, unspecified: Secondary | ICD-10-CM | POA: Diagnosis not present

## 2015-02-01 DIAGNOSIS — Z510 Encounter for antineoplastic radiation therapy: Secondary | ICD-10-CM | POA: Diagnosis not present

## 2015-02-01 DIAGNOSIS — C519 Malignant neoplasm of vulva, unspecified: Secondary | ICD-10-CM | POA: Diagnosis not present

## 2015-02-01 DIAGNOSIS — L598 Other specified disorders of the skin and subcutaneous tissue related to radiation: Secondary | ICD-10-CM | POA: Diagnosis not present

## 2015-02-02 DIAGNOSIS — C519 Malignant neoplasm of vulva, unspecified: Secondary | ICD-10-CM | POA: Diagnosis not present

## 2015-02-02 DIAGNOSIS — L598 Other specified disorders of the skin and subcutaneous tissue related to radiation: Secondary | ICD-10-CM | POA: Diagnosis not present

## 2015-02-02 DIAGNOSIS — Z510 Encounter for antineoplastic radiation therapy: Secondary | ICD-10-CM | POA: Diagnosis not present

## 2015-02-05 DIAGNOSIS — C519 Malignant neoplasm of vulva, unspecified: Secondary | ICD-10-CM | POA: Diagnosis not present

## 2015-02-05 DIAGNOSIS — Z510 Encounter for antineoplastic radiation therapy: Secondary | ICD-10-CM | POA: Diagnosis not present

## 2015-02-05 DIAGNOSIS — L598 Other specified disorders of the skin and subcutaneous tissue related to radiation: Secondary | ICD-10-CM | POA: Diagnosis not present

## 2015-02-06 DIAGNOSIS — L598 Other specified disorders of the skin and subcutaneous tissue related to radiation: Secondary | ICD-10-CM | POA: Diagnosis not present

## 2015-02-06 DIAGNOSIS — C519 Malignant neoplasm of vulva, unspecified: Secondary | ICD-10-CM | POA: Diagnosis not present

## 2015-02-06 DIAGNOSIS — Z510 Encounter for antineoplastic radiation therapy: Secondary | ICD-10-CM | POA: Diagnosis not present

## 2015-02-07 ENCOUNTER — Other Ambulatory Visit: Payer: Self-pay | Admitting: Radiation Oncology

## 2015-02-07 DIAGNOSIS — Z510 Encounter for antineoplastic radiation therapy: Secondary | ICD-10-CM | POA: Diagnosis not present

## 2015-02-07 DIAGNOSIS — C519 Malignant neoplasm of vulva, unspecified: Secondary | ICD-10-CM | POA: Diagnosis not present

## 2015-02-07 DIAGNOSIS — L598 Other specified disorders of the skin and subcutaneous tissue related to radiation: Secondary | ICD-10-CM | POA: Diagnosis not present

## 2015-02-07 DIAGNOSIS — L589 Radiodermatitis, unspecified: Secondary | ICD-10-CM

## 2015-02-07 MED ORDER — SILVER SULFADIAZINE 1 % EX CREA: 1.0000 "application " | TOPICAL_CREAM | Freq: Two times a day (BID) | CUTANEOUS | Status: DC

## 2015-02-08 DIAGNOSIS — L598 Other specified disorders of the skin and subcutaneous tissue related to radiation: Secondary | ICD-10-CM | POA: Diagnosis not present

## 2015-02-08 DIAGNOSIS — C519 Malignant neoplasm of vulva, unspecified: Secondary | ICD-10-CM | POA: Diagnosis not present

## 2015-02-08 DIAGNOSIS — Z510 Encounter for antineoplastic radiation therapy: Secondary | ICD-10-CM | POA: Diagnosis not present

## 2015-02-09 DIAGNOSIS — Z510 Encounter for antineoplastic radiation therapy: Secondary | ICD-10-CM | POA: Diagnosis not present

## 2015-02-09 DIAGNOSIS — L598 Other specified disorders of the skin and subcutaneous tissue related to radiation: Secondary | ICD-10-CM | POA: Diagnosis not present

## 2015-02-09 DIAGNOSIS — C519 Malignant neoplasm of vulva, unspecified: Secondary | ICD-10-CM | POA: Diagnosis not present

## 2015-02-12 DIAGNOSIS — L598 Other specified disorders of the skin and subcutaneous tissue related to radiation: Secondary | ICD-10-CM | POA: Diagnosis not present

## 2015-02-12 DIAGNOSIS — Z510 Encounter for antineoplastic radiation therapy: Secondary | ICD-10-CM | POA: Diagnosis not present

## 2015-02-12 DIAGNOSIS — C519 Malignant neoplasm of vulva, unspecified: Secondary | ICD-10-CM | POA: Diagnosis not present

## 2015-02-13 DIAGNOSIS — L598 Other specified disorders of the skin and subcutaneous tissue related to radiation: Secondary | ICD-10-CM | POA: Diagnosis not present

## 2015-02-13 DIAGNOSIS — C519 Malignant neoplasm of vulva, unspecified: Secondary | ICD-10-CM | POA: Diagnosis not present

## 2015-02-13 DIAGNOSIS — Z510 Encounter for antineoplastic radiation therapy: Secondary | ICD-10-CM | POA: Diagnosis not present

## 2015-02-14 DIAGNOSIS — Z510 Encounter for antineoplastic radiation therapy: Secondary | ICD-10-CM | POA: Diagnosis not present

## 2015-02-14 DIAGNOSIS — L598 Other specified disorders of the skin and subcutaneous tissue related to radiation: Secondary | ICD-10-CM | POA: Diagnosis not present

## 2015-02-14 DIAGNOSIS — C519 Malignant neoplasm of vulva, unspecified: Secondary | ICD-10-CM | POA: Diagnosis not present

## 2015-02-15 DIAGNOSIS — C519 Malignant neoplasm of vulva, unspecified: Secondary | ICD-10-CM | POA: Diagnosis not present

## 2015-02-15 DIAGNOSIS — L598 Other specified disorders of the skin and subcutaneous tissue related to radiation: Secondary | ICD-10-CM | POA: Diagnosis not present

## 2015-02-15 DIAGNOSIS — Z510 Encounter for antineoplastic radiation therapy: Secondary | ICD-10-CM | POA: Diagnosis not present

## 2015-02-16 DIAGNOSIS — Z510 Encounter for antineoplastic radiation therapy: Secondary | ICD-10-CM | POA: Diagnosis not present

## 2015-02-16 DIAGNOSIS — C519 Malignant neoplasm of vulva, unspecified: Secondary | ICD-10-CM | POA: Diagnosis not present

## 2015-02-20 DIAGNOSIS — Z510 Encounter for antineoplastic radiation therapy: Secondary | ICD-10-CM | POA: Diagnosis not present

## 2015-02-20 DIAGNOSIS — C519 Malignant neoplasm of vulva, unspecified: Secondary | ICD-10-CM | POA: Diagnosis not present

## 2015-02-21 DIAGNOSIS — Z510 Encounter for antineoplastic radiation therapy: Secondary | ICD-10-CM | POA: Diagnosis not present

## 2015-02-21 DIAGNOSIS — C519 Malignant neoplasm of vulva, unspecified: Secondary | ICD-10-CM | POA: Diagnosis not present

## 2015-02-26 DIAGNOSIS — Z510 Encounter for antineoplastic radiation therapy: Secondary | ICD-10-CM | POA: Diagnosis not present

## 2015-02-26 DIAGNOSIS — C519 Malignant neoplasm of vulva, unspecified: Secondary | ICD-10-CM | POA: Diagnosis not present

## 2015-02-27 DIAGNOSIS — C519 Malignant neoplasm of vulva, unspecified: Secondary | ICD-10-CM | POA: Diagnosis not present

## 2015-02-27 DIAGNOSIS — Z510 Encounter for antineoplastic radiation therapy: Secondary | ICD-10-CM | POA: Diagnosis not present

## 2015-02-28 DIAGNOSIS — Z510 Encounter for antineoplastic radiation therapy: Secondary | ICD-10-CM | POA: Diagnosis not present

## 2015-02-28 DIAGNOSIS — C519 Malignant neoplasm of vulva, unspecified: Secondary | ICD-10-CM | POA: Diagnosis not present

## 2015-03-19 DIAGNOSIS — K644 Residual hemorrhoidal skin tags: Secondary | ICD-10-CM | POA: Diagnosis not present

## 2015-03-19 DIAGNOSIS — Z923 Personal history of irradiation: Secondary | ICD-10-CM | POA: Diagnosis not present

## 2015-03-19 DIAGNOSIS — C519 Malignant neoplasm of vulva, unspecified: Secondary | ICD-10-CM | POA: Diagnosis not present

## 2015-04-02 ENCOUNTER — Ambulatory Visit: Payer: Medicare Other | Admitting: Gynecologic Oncology

## 2015-04-02 DIAGNOSIS — K644 Residual hemorrhoidal skin tags: Secondary | ICD-10-CM | POA: Diagnosis not present

## 2015-04-02 DIAGNOSIS — C519 Malignant neoplasm of vulva, unspecified: Secondary | ICD-10-CM | POA: Diagnosis not present

## 2015-04-02 DIAGNOSIS — Z923 Personal history of irradiation: Secondary | ICD-10-CM | POA: Diagnosis not present

## 2015-04-11 DIAGNOSIS — K649 Unspecified hemorrhoids: Secondary | ICD-10-CM | POA: Diagnosis not present

## 2015-04-11 DIAGNOSIS — K5900 Constipation, unspecified: Secondary | ICD-10-CM | POA: Diagnosis not present

## 2015-04-11 DIAGNOSIS — I10 Essential (primary) hypertension: Secondary | ICD-10-CM | POA: Diagnosis not present

## 2015-04-30 ENCOUNTER — Ambulatory Visit: Payer: Medicare Other | Admitting: Gynecologic Oncology

## 2015-05-07 DIAGNOSIS — H00012 Hordeolum externum right lower eyelid: Secondary | ICD-10-CM | POA: Diagnosis not present

## 2015-05-11 ENCOUNTER — Encounter: Payer: Self-pay | Admitting: Gynecologic Oncology

## 2015-05-11 ENCOUNTER — Ambulatory Visit: Payer: Medicare Other | Attending: Gynecologic Oncology | Admitting: Gynecologic Oncology

## 2015-05-11 VITALS — BP 176/72 | HR 70 | Temp 98.3°F | Resp 18 | Ht 62.0 in | Wt 120.5 lb

## 2015-05-11 DIAGNOSIS — C519 Malignant neoplasm of vulva, unspecified: Secondary | ICD-10-CM | POA: Insufficient documentation

## 2015-05-11 NOTE — Patient Instructions (Signed)
You do not need to schedule a followup with Dr. Denman George at this time. Please call our office in the future with any questions or concerns.

## 2015-05-11 NOTE — Progress Notes (Signed)
OFFICE VISIT: Gyn Oncology  Assessment:    81 y.o. year old with a history of recurrent clinical stage IB vulvar SCC.   S/p palliative simple partial right vulvectomy on 03/28/14. Positive microscopic margins, no adjuvant therapy at request of family. Recurrence at the posterior urethra for which she received salvage radiation (5000cGy). She has no residual carcinoma on exam (complete clinical response).  Plan: Patient desires followup with Dr Glo Herring locally. I recommend surveillance exams of the vulva every 3-6 months for 2 years, then 6 monthly until 5 years (August, 2021).  HPI:  Erin Mckay is a 81 y.o. year old initially seen in consultation on 01/28/14 for clinical stage IB vulvar SCC.  She and her family were counseled that optimal treatment of her condition would be a radical vulvectomy with lymphadenectomy. She and her family declined this as they felt strongly that she would not recover adequately from such a radical procedure and were interested in only minimal therapy for symptom relief. We discussed the option of primary radiation, however they declined this given their concerns about morbidity from skin burn. We elected to proceed with a palliative "toiletting" partial vulvectomy understanding that adequacy of margin status would be compromised, though this would likely be an easier procedure to recover from and she would achieve some relief of symptoms or irritation from the cancer.   She then underwent a simple partial right vulvectomy on 09/26/45 without complications.  Her postoperative course was uncomplicated.  Her final pathology revealed squamous cell carcinoma of the vulva, clinical stage IB. She had positive microscopic margins for invasive disease.  She elected for close followup only postop (rather than re-excision or radiation) as the goals to surgery were palliative only.   She developed a recurrence on the posterior urethral meatus diagnosed on May 9th, 2016. She was  treated with primary radiation to the vulva 5000cGy with Dr Sondra Come in [place]. Therapy was completed in July, 2016.  Interval Hx: She initially had severe radiation changes and burning however, this has completely dissapated since completing radiation.  Allergies  Allergen Reactions  . Pineapple Swelling  . Streptomycin     Any mycin drugs, causes numbness all over body    Outpatient Encounter Prescriptions as of 05/11/2015  Medication Sig Note  . doxycycline (VIBRA-TABS) 100 MG tablet Take 100 mg by mouth daily.  05/11/2015: Received from: External Pharmacy  . HYDROcodone-acetaminophen (NORCO/VICODIN) 5-325 MG per tablet 1 tablet every 6 (six) hours as needed.  05/11/2015: Received from: External Pharmacy  . hydrocortisone (PROCTOZONE-HC) 2.5 % rectal cream Place 1 application rectally 2 (two) times daily.   Marland Kitchen ibuprofen (ADVIL,MOTRIN) 200 MG tablet Take 200 mg by mouth every 6 (six) hours as needed.   Marland Kitchen lisinopril (PRINIVIL,ZESTRIL) 40 MG tablet Take 40 mg by mouth at bedtime.    Marland Kitchen tobramycin-dexamethasone (TOBRADEX) ophthalmic solution Place 1 drop into the right eye 5 (five) times daily.  05/11/2015: Received from: External Pharmacy  . AMITIZA 24 MCG capsule  05/11/2015: Received from: External Pharmacy  . silver sulfADIAZINE (SILVADENE) 1 % cream Apply 1 application topically 2 (two) times daily. To affected area (Patient not taking: Reported on 05/11/2015)   . [DISCONTINUED] acetaminophen-codeine (TYLENOL #3) 300-30 MG per tablet 1/2 to 1 po q6h for 3 days then as needed for rectal pain   . [DISCONTINUED] fluconazole (DIFLUCAN) 150 MG tablet Take 1 tablet (150 mg total) by mouth daily.    No facility-administered encounter medications on file as of 05/11/2015.   Past  Medical History  Diagnosis Date  . Hypertension   . Frequent UTI   . Hard of hearing   . Vision loss, bilateral   . Measles   . Vulvar cancer 02/09/14  . Cataract     03-20-14 right eye" afraid to have surgery"  . Hemorrhoids      remains a problem.  . Vulvar cancer 03/28/14  . 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 Tubal ligation    . Appendectomy    . Eye surgery Left     cataract removal and was blinded.  . Cataract extraction Left     "blinded" s/p cataract surgery, cataract on left -"afraid to have surgery"  . Vulvectomy Right 03/28/2014    Procedure: WIDE LOCAL EXCISION OF RIGHT VULVAR;  Surgeon: Everitt Amber, MD;  Location: [facility];  Service: Gynecology;  Laterality: Right;  . Flexible sigmoidoscopy N/A 08/08/2014    Dr. Oneida Alar: moderate sized hemorrhoids Grade 2-3 with banding X 3. Mild diverticulosis in sigmoid colon  . Hemorrhoid banding N/A 08/08/2014    Procedure: HEMORRHOID BANDING;  Surgeon: Danie Binder, MD;  [facility];  Service: Endoscopy;  Laterality: N/A;  . Flexible sigmoidoscopy N/A 08/22/2014    Procedure: FLEXIBLE SIGMOIDOSCOPY;  Surgeon: Danie Binder, MD;  [facility]: [facility];  Service: Endoscopy;  Laterality: N/A;  18   Family History  Problem Relation Age of Onset  . Heart disease Mother   . Early death Father   . Anuerysm Sister     stomach  . Cancer Brother   . Hypertension Daughter   . Hyperlipidemia Daughter   . Cancer Son     bladder  . Heart disease Brother   . Hypertension Son   . Hypertension Son   . Hypertension Daughter   . Colon cancer Neg Hx    Social History   Social History  . Marital Status: Widowed    Spouse Name: N/A  . Number of Children: N/A  . Years of Education: N/A   Social History Main Topics  . Smoking status: Current Every Day Smoker -- 2.00 packs/day for 48 years    Types: Cigarettes  . Smokeless tobacco: Former Systems developer  . Alcohol Use: No  . Drug Use: No  . Sexual Activity: Not Currently    Birth Control/ Protection: Post-menopausal   Other Topics Concern  . None   Social History Narrative    Review of  systems: Constitutional:  She has no weight gain or weight loss. She has no fever or chills. Eyes: No blurred vision Ears, Nose, Mouth, Throat: No dizziness, headaches or changes in hearing. No mouth sores. Cardiovascular: No chest pain, palpitations or edema. Respiratory:  No shortness of breath, wheezing or cough Gastrointestinal: She has normal bowel movements without diarrhea or constipation. She denies any nausea or vomiting. She denies blood in her stool or heart burn. Genitourinary:  See HPI Musculoskeletal: Denies muscle weakness or joint pains.  Skin:  She has no skin changes, rashes or itching Neurological:  Denies dizziness or headaches. No neuropathy, no numbness or tingling. Psychiatric:  She denies depression or anxiety. Hematologic/Lymphatic:   No easy bruising or bleeding  Physical Exam: Blood pressure 176/72, pulse 70, temperature 98.3 F (36.8 C), temperature source Oral, resp. rate 18, height 5\' 2"  (1.575 m), weight 120 lb 8 oz (54.658 kg), SpO2 99 %. General: Well  dressed, well nourished in no apparent distress.   HEENT:  Normocephalic and atraumatic, no lesions.  Extraocular muscles intact. Sclerae anicteric. Pupils equal, round, reactive. No mouth sores or ulcers. Thyroid is normal size, not nodular, midline. Skin:  No lesions or rashes. Breasts:  deferred. Lungs:  Clear to auscultation bilaterally.  No wheezes. Cardiovascular:  Regular rate and rhythm.  No murmurs or rubs. Abdomen:  Soft, nontender, nondistended.  No palpable masses.  No hepatosplenomegaly.  No ascites. Normal bowel sounds.  Bilateral inguinal hernias.   Genitourinary: no visible or palpable lesions. Mild radiation changes. Extremities: No cyanosis, clubbing or edema.  No calf tenderness or erythema. No palpable cords. Psychiatric: Mood and affect are appropriate. Neurological: Awake, alert and oriented x 3. Sensation is intact, no neuropathy.  Musculoskeletal: No pain, normal strength and range of  motion.    Donaciano Eva, MD

## 2015-06-21 DIAGNOSIS — K649 Unspecified hemorrhoids: Secondary | ICD-10-CM | POA: Diagnosis not present

## 2015-06-21 DIAGNOSIS — I10 Essential (primary) hypertension: Secondary | ICD-10-CM | POA: Diagnosis not present

## 2015-06-21 DIAGNOSIS — M419 Scoliosis, unspecified: Secondary | ICD-10-CM | POA: Diagnosis not present

## 2015-07-02 DIAGNOSIS — Z923 Personal history of irradiation: Secondary | ICD-10-CM | POA: Diagnosis not present

## 2015-07-02 DIAGNOSIS — C519 Malignant neoplasm of vulva, unspecified: Secondary | ICD-10-CM | POA: Diagnosis not present

## 2015-07-26 ENCOUNTER — Ambulatory Visit: Payer: Medicare Other | Admitting: Obstetrics and Gynecology

## 2015-08-01 ENCOUNTER — Encounter: Payer: Self-pay | Admitting: Obstetrics and Gynecology

## 2015-08-01 ENCOUNTER — Ambulatory Visit (INDEPENDENT_AMBULATORY_CARE_PROVIDER_SITE_OTHER): Payer: Medicare Other | Admitting: Obstetrics and Gynecology

## 2015-08-01 VITALS — BP 180/90 | Ht 61.0 in | Wt 127.0 lb

## 2015-08-01 DIAGNOSIS — N94819 Vulvodynia, unspecified: Secondary | ICD-10-CM

## 2015-08-01 DIAGNOSIS — C519 Malignant neoplasm of vulva, unspecified: Secondary | ICD-10-CM

## 2015-08-01 DIAGNOSIS — Z8544 Personal history of malignant neoplasm of other female genital organs: Secondary | ICD-10-CM | POA: Diagnosis not present

## 2015-08-01 MED ORDER — HYDROCORTISONE ACE-PRAMOXINE 1-1 % RE CREA: 1.0000 "application " | TOPICAL_CREAM | Freq: Two times a day (BID) | RECTAL | Status: DC

## 2015-08-01 NOTE — Progress Notes (Signed)
Patient ID: Erin Mckay, female   DOB: July 21, 1924, 81 y.o.   MRN: [id number] Pt here today for multiple complaints. Pt has burning and itching after after she took an antibiotic. Pt also has terrible hemorrhoids that she can not get any relief from.

## 2015-08-01 NOTE — Progress Notes (Signed)
Patient ID: Erin Mckay, female   DOB: 22-Jun-1924, 81 y.o.   MRN: [id number]   [facility] Visit  Patient name: Erin Mckay MRN [id number]  Date of birth: 1924/07/29  CC & HPI:  Erin Mckay is a 81 y.o. female with h/o HTN, vulvar cancer, tubal ligation, vulvectomy, frequent UTI, hemorrhoids presenting today for moderate itching and burning pain of her external genitalia onset this week. She also complains of a flare-up of recurrent hemorrhoids. Per family member, she has been taking Vicodin for her hemorrhoid pain with mild to moderate relief. Pt has a h/o vulvar cancer and was treated with radiation therapy in 2015.  ROS:  A complete 10 system review of systems was obtained and all systems are negative except as noted in the HPI and PMH.    Pertinent History Reviewed:   Reviewed: Significant for HTN, vulvar cancer, tubal ligation, vulvectomy, frequent UTI, hemorrhoids   Medical         Past Medical History  Diagnosis Date  . Hypertension   . Frequent UTI   . Hard of hearing   . Vision loss, bilateral   . Measles   . Vulvar cancer ([place]) 02/09/14  . Cataract     03-20-14 right eye" afraid to have surgery"  . Hemorrhoids     remains a problem.  . Vulvar cancer ([place]) 03/28/14  . 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 Tubal ligation    . Appendectomy    . Eye surgery Left     cataract removal and was blinded.  . Cataract extraction Left     "blinded" s/p cataract surgery, cataract on left -"afraid to have surgery"  . Vulvectomy Right 03/28/2014    Procedure: WIDE LOCAL EXCISION OF RIGHT VULVAR;  Surgeon: Everitt Amber, MD;  Location: [facility];  Service: Gynecology;  Laterality: Right;  . Flexible sigmoidoscopy N/A 08/08/2014    Dr. Oneida Alar: moderate sized hemorrhoids Grade 2-3 with banding X 3. Mild diverticulosis in sigmoid colon   . Hemorrhoid banding N/A 08/08/2014    Procedure: HEMORRHOID BANDING;  Surgeon: Danie Binder, MD;  [facility];  Service: Endoscopy;  Laterality: N/A;  . Flexible sigmoidoscopy N/A 08/22/2014    Procedure: FLEXIBLE SIGMOIDOSCOPY;  Surgeon: Danie Binder, MD;  [facility];  Service: Endoscopy;  Laterality: N/A;  1115   Medications: Reviewed & Updated - see associated section                       Current outpatient prescriptions:  .  HYDROcodone-acetaminophen (NORCO/VICODIN) 5-325 MG per tablet, 1 tablet every 6 (six) hours as needed. , Disp: , Rfl:  .  hydrocortisone (PROCTOZONE-HC) 2.5 % rectal cream, Place 1 application rectally 2 (two) times daily., Disp: 30 g, Rfl: 3 .  lisinopril (PRINIVIL,ZESTRIL) 40 MG tablet, Take 40 mg by mouth at bedtime. , Disp: , Rfl:    Social History: Reviewed -  reports that she has been smoking Cigarettes.  She has a 96 pack-year smoking history. She has quit using smokeless tobacco.  Objective Findings:  Vitals: Blood pressure 180/90, height 5\' 1"  (1.549 m), weight 127 lb (57.607  kg).  Physical Examination: General appearance - alert, well appearing, and in no distress and oriented to person, place, and time Mental status - alert, oriented to person, place, and time, normal mood, behavior, speech, dress, motor activity, and thought processes ABDOMEN- Left inguinal hernia present. Stable known to pt. Who refuses tx. Pelvic - normal external genitalia, vulva, vagina, cervix, uterus and adnexa; Minimal adenopathy. One small 24mm lymph node in the right groin.  VULVA: normal appearing vulva s/p Radiatiion changes. with no masses, tenderness or lesions,  VAGINA: normal appearing vagina with normal color and discharge, no lesions,  ADNEXA: normal adnexa in size, nontender and no masses RECTAL- Normal rectal tone. One small internal hemorrhoid present.   Assessment & Plan:   A:  1. Inguinal Hernia stable pt refuses tx or referral 2.  One small hemorrhoid 3. Normal radiation changes s/p vulvar cancer and vulvectomy   P:  1. Apply topical ProctoCream to areas of itching and hemorrhoids    By signing my name below, I, Hansel Feinstein, attest that this documentation has been prepared under the direction and in the presence of Jonnie Kind, MD. Electronically Signed: Hansel Feinstein, ED Scribe. 08/01/2015. 3:09 PM.  I personally performed the services described in this documentation, which was SCRIBED in my presence. The recorded information has been reviewed and considered accurate. It has been edited as necessary during review. Jonnie Kind, MD

## 2015-09-24 DIAGNOSIS — R300 Dysuria: Secondary | ICD-10-CM | POA: Diagnosis not present

## 2015-11-05 ENCOUNTER — Encounter: Payer: Self-pay | Admitting: Obstetrics and Gynecology

## 2015-11-05 ENCOUNTER — Ambulatory Visit (INDEPENDENT_AMBULATORY_CARE_PROVIDER_SITE_OTHER): Payer: Medicare Other | Admitting: Obstetrics and Gynecology

## 2015-11-05 VITALS — BP 166/70 | Ht 64.0 in | Wt 129.0 lb

## 2015-11-05 DIAGNOSIS — K640 First degree hemorrhoids: Secondary | ICD-10-CM | POA: Diagnosis not present

## 2015-11-05 MED ORDER — HYDROCORTISONE 2.5 % RE CREA: 1.0000 "application " | TOPICAL_CREAM | Freq: Two times a day (BID) | RECTAL | Status: DC

## 2015-11-05 NOTE — Progress Notes (Signed)
Patient ID: Erin Mckay, female   DOB: 20-Sep-1923, 82 y.o.   MRN: [id number]   [facility] Visit  Patient name: Erin Mckay MRN [id number]  Date of birth: 1924-06-17  CC & HPI:  Erin Mckay is a 82 y.o. female presenting today for 1. Recheck of hemorrhoids, and 2. F/u vulvar cancer. tx'd radiation tx.  ROS:    Pertinent History Reviewed:   Reviewed: Significant for  Medical         Past Medical History  Diagnosis Date  . Hypertension   . Frequent UTI   . Hard of hearing   . Vision loss, bilateral   . Measles   . Vulvar cancer ([place]) 02/09/14  . Cataract     03-20-14 right eye" afraid to have surgery"  . Hemorrhoids     remains a problem.  . Vulvar cancer ([place]) 03/28/14  . 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 Tubal ligation    . Appendectomy    . Eye surgery Left     cataract removal and was blinded.  . Cataract extraction Left     "blinded" s/p cataract surgery, cataract on left -"afraid to have surgery"  . Vulvectomy Right 03/28/2014    Procedure: WIDE LOCAL EXCISION OF RIGHT VULVAR;  Surgeon: Everitt Amber, MD;  [facility];  Service: Gynecology;  Laterality: Right;  . Flexible sigmoidoscopy N/A 08/08/2014    Dr. Oneida Alar: moderate sized hemorrhoids Grade 2-3 with banding X 3. Mild diverticulosis in sigmoid colon  . Hemorrhoid banding N/A 08/08/2014    Procedure: HEMORRHOID BANDING;  Surgeon: Danie Binder, MD;  [facility];  Service: Endoscopy;  Laterality: N/A;  . Flexible sigmoidoscopy N/A 08/22/2014    Procedure: FLEXIBLE SIGMOIDOSCOPY;  Surgeon: Danie Binder, MD;  [facility];  Service: Endoscopy;  Laterality: N/A;  1115   Medications: Reviewed & Updated - see associated section                       Current outpatient prescriptions:  .  Ca Carbonate-Mag Hydroxide (ROLAIDS PO),  Take 3 each by mouth as needed., Disp: , Rfl:  .  HYDROcodone-acetaminophen (NORCO/VICODIN) 5-325 MG per tablet, 1 tablet every 6 (six) hours as needed. , Disp: , Rfl:  .  hydrocortisone (PROCTOZONE-HC) 2.5 % rectal cream, Place 1 application rectally 2 (two) times daily., Disp: 30 g, Rfl: 3 .  lisinopril (PRINIVIL,ZESTRIL) 40 MG tablet, Take 40 mg by mouth at bedtime. , Disp: , Rfl:  .  simethicone (MYLICON) 0000000 MG chewable tablet, Chew 125 mg by mouth every 6 (six) hours as needed for flatulence., Disp: , Rfl:  .  polyethylene glycol (MIRALAX / GLYCOLAX) packet, Take 17 g by mouth daily., Disp: , Rfl:    Social History: Reviewed -  reports that she has been smoking Cigarettes.  She has a 96 pack-year smoking history. She has quit using smokeless tobacco.  Objective Findings:  Vitals: Blood pressure 166/70, height 5\' 4"  (1.626 m), weight 129 lb (58.514 kg).  Physical Examination: General appearance - alert, well appearing, and in no distress, oriented to person, place, and time and normal appearing  weight Mental status - alert, oriented to person, place, and time, normal mood, behavior, speech, dress, motor activity, and thought processes Pelvic - VULVA: normal appearing vulva with no masses, tenderness or lesions, vulvar hypopigmentation and loss of labial creases, VAGINA: atrophic, RECTAL: external hemorrhoids non-thrombosed   Assessment & Plan:   A:  1. Vulvar cancer no evidence of recurrence 2. Ext hemorrhoids  P:  1. Silvadene to vulva 2. 2.5% hydrocortisone for hemorrhoid.

## 2015-11-20 ENCOUNTER — Ambulatory Visit: Payer: Medicare Other | Admitting: Obstetrics and Gynecology

## 2015-12-11 DIAGNOSIS — B029 Zoster without complications: Secondary | ICD-10-CM | POA: Diagnosis not present

## 2015-12-11 DIAGNOSIS — I10 Essential (primary) hypertension: Secondary | ICD-10-CM | POA: Diagnosis not present

## 2015-12-11 DIAGNOSIS — M419 Scoliosis, unspecified: Secondary | ICD-10-CM | POA: Diagnosis not present

## 2015-12-11 DIAGNOSIS — K649 Unspecified hemorrhoids: Secondary | ICD-10-CM | POA: Diagnosis not present

## 2015-12-26 DIAGNOSIS — Z8719 Personal history of other diseases of the digestive system: Secondary | ICD-10-CM | POA: Diagnosis not present

## 2015-12-26 DIAGNOSIS — I10 Essential (primary) hypertension: Secondary | ICD-10-CM | POA: Diagnosis not present

## 2015-12-26 DIAGNOSIS — R21 Rash and other nonspecific skin eruption: Secondary | ICD-10-CM | POA: Diagnosis not present

## 2015-12-26 DIAGNOSIS — N390 Urinary tract infection, site not specified: Secondary | ICD-10-CM | POA: Diagnosis not present

## 2015-12-26 DIAGNOSIS — F17200 Nicotine dependence, unspecified, uncomplicated: Secondary | ICD-10-CM | POA: Diagnosis not present

## 2015-12-26 DIAGNOSIS — R319 Hematuria, unspecified: Secondary | ICD-10-CM | POA: Diagnosis not present

## 2015-12-26 DIAGNOSIS — Z79899 Other long term (current) drug therapy: Secondary | ICD-10-CM | POA: Diagnosis not present

## 2015-12-31 DIAGNOSIS — G8929 Other chronic pain: Secondary | ICD-10-CM | POA: Diagnosis not present

## 2016-01-01 ENCOUNTER — Telehealth: Payer: Self-pay

## 2016-01-01 DIAGNOSIS — Z881 Allergy status to other antibiotic agents status: Secondary | ICD-10-CM | POA: Diagnosis not present

## 2016-01-01 DIAGNOSIS — Z8542 Personal history of malignant neoplasm of other parts of uterus: Secondary | ICD-10-CM | POA: Diagnosis not present

## 2016-01-01 DIAGNOSIS — F17200 Nicotine dependence, unspecified, uncomplicated: Secondary | ICD-10-CM | POA: Diagnosis not present

## 2016-01-01 DIAGNOSIS — K644 Residual hemorrhoidal skin tags: Secondary | ICD-10-CM | POA: Diagnosis not present

## 2016-01-01 DIAGNOSIS — K6289 Other specified diseases of anus and rectum: Secondary | ICD-10-CM | POA: Diagnosis not present

## 2016-01-01 DIAGNOSIS — Z79899 Other long term (current) drug therapy: Secondary | ICD-10-CM | POA: Diagnosis not present

## 2016-01-01 DIAGNOSIS — K649 Unspecified hemorrhoids: Secondary | ICD-10-CM | POA: Diagnosis not present

## 2016-01-01 DIAGNOSIS — Z880 Allergy status to penicillin: Secondary | ICD-10-CM | POA: Diagnosis not present

## 2016-01-01 DIAGNOSIS — F0390 Unspecified dementia without behavioral disturbance: Secondary | ICD-10-CM | POA: Diagnosis not present

## 2016-01-01 NOTE — Telephone Encounter (Signed)
Called daughter and LMOM to call office back

## 2016-01-01 NOTE — Telephone Encounter (Signed)
Yes, needs office visit. Hasn't been in the office since Dec 2015.

## 2016-01-01 NOTE — Telephone Encounter (Signed)
Pt daughter Velva Harman called and states that the patient is complaining of rectal pain is wanting to have another referral to the cancer center and states that he PCP has given her pain medication for her hemorrhoids and that she has also went to the ER for her hemorrhoids.   Please advise. Does pt need OV?

## 2016-01-03 NOTE — Telephone Encounter (Signed)
Called Velva Harman back and states that she took patient to the ER and they have sent a RX in for her. States she is going to see how the new medication works and will call back to set up an OV.

## 2016-01-18 DIAGNOSIS — R300 Dysuria: Secondary | ICD-10-CM | POA: Diagnosis not present

## 2016-01-18 DIAGNOSIS — H548 Legal blindness, as defined in USA: Secondary | ICD-10-CM | POA: Diagnosis not present

## 2016-01-18 DIAGNOSIS — F17200 Nicotine dependence, unspecified, uncomplicated: Secondary | ICD-10-CM | POA: Diagnosis not present

## 2016-01-18 DIAGNOSIS — Z8544 Personal history of malignant neoplasm of other female genital organs: Secondary | ICD-10-CM | POA: Diagnosis not present

## 2016-01-18 DIAGNOSIS — R208 Other disturbances of skin sensation: Secondary | ICD-10-CM | POA: Diagnosis not present

## 2016-01-18 DIAGNOSIS — L292 Pruritus vulvae: Secondary | ICD-10-CM | POA: Diagnosis not present

## 2016-01-18 DIAGNOSIS — Z87440 Personal history of urinary (tract) infections: Secondary | ICD-10-CM | POA: Diagnosis not present

## 2016-01-18 DIAGNOSIS — Z79899 Other long term (current) drug therapy: Secondary | ICD-10-CM | POA: Diagnosis not present

## 2016-01-18 DIAGNOSIS — H9190 Unspecified hearing loss, unspecified ear: Secondary | ICD-10-CM | POA: Diagnosis not present

## 2016-01-21 DIAGNOSIS — I10 Essential (primary) hypertension: Secondary | ICD-10-CM | POA: Diagnosis not present

## 2016-01-21 DIAGNOSIS — Z8544 Personal history of malignant neoplasm of other female genital organs: Secondary | ICD-10-CM | POA: Diagnosis not present

## 2016-01-21 DIAGNOSIS — Z08 Encounter for follow-up examination after completed treatment for malignant neoplasm: Secondary | ICD-10-CM | POA: Diagnosis not present

## 2016-01-21 DIAGNOSIS — K649 Unspecified hemorrhoids: Secondary | ICD-10-CM | POA: Diagnosis not present

## 2016-01-21 DIAGNOSIS — Z79899 Other long term (current) drug therapy: Secondary | ICD-10-CM | POA: Diagnosis not present

## 2016-01-30 ENCOUNTER — Ambulatory Visit: Payer: Medicare Other | Admitting: Gastroenterology

## 2016-02-08 DIAGNOSIS — I10 Essential (primary) hypertension: Secondary | ICD-10-CM | POA: Diagnosis not present

## 2016-02-08 DIAGNOSIS — K649 Unspecified hemorrhoids: Secondary | ICD-10-CM | POA: Diagnosis not present

## 2016-02-08 DIAGNOSIS — Z79899 Other long term (current) drug therapy: Secondary | ICD-10-CM | POA: Diagnosis not present

## 2016-02-08 DIAGNOSIS — K439 Ventral hernia without obstruction or gangrene: Secondary | ICD-10-CM | POA: Diagnosis not present

## 2016-02-08 DIAGNOSIS — R102 Pelvic and perineal pain: Secondary | ICD-10-CM | POA: Diagnosis not present

## 2016-02-08 DIAGNOSIS — Z720 Tobacco use: Secondary | ICD-10-CM | POA: Diagnosis not present

## 2016-02-08 DIAGNOSIS — F17200 Nicotine dependence, unspecified, uncomplicated: Secondary | ICD-10-CM | POA: Diagnosis not present

## 2016-02-08 DIAGNOSIS — Z8544 Personal history of malignant neoplasm of other female genital organs: Secondary | ICD-10-CM | POA: Diagnosis not present

## 2016-02-20 ENCOUNTER — Encounter: Payer: Self-pay | Admitting: Obstetrics and Gynecology

## 2016-02-20 ENCOUNTER — Ambulatory Visit (INDEPENDENT_AMBULATORY_CARE_PROVIDER_SITE_OTHER): Payer: Medicare Other | Admitting: Obstetrics and Gynecology

## 2016-02-20 VITALS — BP 200/100 | Ht 62.0 in | Wt 131.0 lb

## 2016-02-20 DIAGNOSIS — Z8544 Personal history of malignant neoplasm of other female genital organs: Secondary | ICD-10-CM

## 2016-02-20 DIAGNOSIS — K640 First degree hemorrhoids: Secondary | ICD-10-CM

## 2016-02-20 DIAGNOSIS — N904 Leukoplakia of vulva: Secondary | ICD-10-CM | POA: Diagnosis not present

## 2016-02-20 DIAGNOSIS — C519 Malignant neoplasm of vulva, unspecified: Secondary | ICD-10-CM

## 2016-02-20 NOTE — Progress Notes (Signed)
Patient ID: Erin Mckay, female   DOB: July 26, 1924, 82 y.o.   MRN: [id number]   [facility] Visit  @DATE @            Patient name: Erin Mckay MRN [id number]  Date of birth: [birth year]  CC & HPI:  Erin Mckay is a 82 y.o. female presenting today for Follow-up regarding vulvar cancer treated with surgery and radiation therapy. Currently she is on a every 3 month monitoring of vulvar tissues. She has chronic complaints of vulvar discomfort.Erin Mckay is primarily the patient's daughter, who worries to ensure that she is being adequately vigilant in her mother's care. Additionally the patient  daughter describes her mother is having "horrible" hemorrhoids  ROS:  ROS patient has allegedly been tried on Desitin, , Silvadene and other topical agents for vulvar discomfort. The daughter says that none of them working. The patient did not have same complaints.    Pertinent History Reviewed:   Reviewed: Significant for Vulvar cancer 2015 stage IB squamous cell carcinoma. For which radical vulvectomy was refused by family. A palliative "toileting partial vulvectomy was done by Dr. Denman George, with final pathology showing a stage IB clinical SSC of the vulva with microscopic involvement of the margins. The patient has been seen every 3 months either by myself or Dr. Denman George. Last visit was January 2016 by Dr. Denman George.  Medical         Past Medical History  Diagnosis Date  . Hypertension   . Frequent UTI   . Hard of hearing   . Vision loss, bilateral   . Measles   . Vulvar cancer ([place]) 02/09/14  . Cataract     03-20-14 right eye" afraid to have surgery"  . Hemorrhoids     remains a problem.  . Vulvar cancer ([place]) 03/28/14  . 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 Tubal ligation    . Appendectomy    . Eye surgery Left     cataract removal and was  blinded.  . Cataract extraction Left     "blinded" s/p cataract surgery, cataract on left -"afraid to have surgery"  . Vulvectomy Right 03/28/2014    Procedure: WIDE LOCAL EXCISION OF RIGHT VULVAR;  Surgeon: Everitt Amber, MD;  [facility];  Service: Gynecology;  Laterality: Right;  . Flexible sigmoidoscopy N/A 08/08/2014    Dr. Oneida Alar: moderate sized hemorrhoids Grade 2-3 with banding X 3. Mild diverticulosis in sigmoid colon  . Hemorrhoid banding N/A 08/08/2014    Procedure: HEMORRHOID BANDING;  Surgeon: Danie Binder, MD;  [facility];  Service: Endoscopy;  Laterality: N/A;  . Flexible sigmoidoscopy N/A 08/22/2014    Procedure: FLEXIBLE SIGMOIDOSCOPY;  Surgeon: Danie Binder, MD;  [facility];  Service: Endoscopy;  Laterality: N/A;  1115   Medications: Reviewed & Updated - see associated section                       Current outpatient prescriptions:  .  Ca Carbonate-Mag Hydroxide (ROLAIDS PO), Take 3 each by mouth as needed., Disp: , Rfl:  .  docusate sodium (COLACE) 100 MG capsule, Take 100 mg by  mouth 2 (two) times daily., Disp: , Rfl:  .  HYDROcodone-acetaminophen (NORCO) 10-325 MG tablet, Take 1 tablet by mouth every 4 (four) hours as needed., Disp: , Rfl:  .  hydrocortisone (PROCTOZONE-HC) 2.5 % rectal cream, Place 1 application rectally 2 (two) times daily., Disp: 30 g, Rfl: 3 .  lisinopril (PRINIVIL,ZESTRIL) 40 MG tablet, Take 40 mg by mouth at bedtime. , Disp: , Rfl:  .  mometasone (ELOCON) 0.1 % cream, APPLY RECTALLY TWICE DAILY FOR 14 DAYS, Disp: , Rfl: 0 .  polyethylene glycol (MIRALAX / GLYCOLAX) packet, Take 17 g by mouth daily., Disp: , Rfl:  .  simethicone (MYLICON) 0000000 MG chewable tablet, Chew 125 mg by mouth every 6 (six) hours as needed for flatulence., Disp: , Rfl:    Social History: Reviewed -  reports that she has been smoking Cigarettes.  She has a 48 pack-year smoking history. She has quit using smokeless tobacco.  Objective Findings:   Vitals: Blood pressure 200/100, height 5\' 2"  (1.575 m), weight 131 lb (59.421 kg).  Physical Examination: General appearance - alert, well appearing, and in no distress, oriented to person, place, and time, normal appearing weight and anxious Mental status - alert, oriented to person, place, and time, normal mood, behavior, speech, dress, motor activity, and thought processes Abdomen - soft, nontender, nondistended, no masses or organomegaly Pelvic - VULVA: normal appearing vulva with no masses, tenderness , there is a small 5 mm area of white skin thickening and firmness on the right side, even with the urethra that may represent  hyperkeratosis, or possibly recurrence   VAGINA: normal appearing vagina with normal color and discharge, no lesions, atrophic Skin - normal coloration and turgor, no rashes, no suspicious skin lesions noted Lymph nodes normal inguinal areas Interestingly the patient does not have any active hemorrhoids at this time digital rectal exam showed 1 single non-prolapsed hemorrhoid anteriorly. The patient tolerates the exam quite well  Assessment & Plan:   A:  1. Status post palliative vulvectomy for stage I- B vulvar cancer with microinvasive involvement surgical margins 2015 2. Internal hemorrhoids singular without prolapse, less severe than expected based on daughters history 3. Small hyperkeratosis on right inner labia minora will require excisional biopsy  P:  1. Patient to return later  for excisional biopsy under local anesthesia in the office

## 2016-02-20 NOTE — Progress Notes (Signed)
Patient ID: Erin Mckay, female   DOB: 29-Oct-1923, 82 y.o.   MRN: [id number] Pt here today for vaginal irritation. Pt has irritation from an antibiotic that she had to take for a UTI.

## 2016-02-21 ENCOUNTER — Telehealth: Payer: Self-pay | Admitting: *Deleted

## 2016-02-21 NOTE — Telephone Encounter (Signed)
Pt's daughter wants to know if we could get Toy in with the new cancer Dr. [place]. Pt states that if she is going to have a lot of appointments and stuff she wants to go close by instead of going all the way to [place].

## 2016-02-25 ENCOUNTER — Telehealth: Payer: Self-pay | Admitting: Obstetrics and Gynecology

## 2016-02-25 NOTE — Telephone Encounter (Signed)
Pt's daughter called stating that she would like a call back from Either Dr. Glo Herring or his nurse, Spoke with Velva Harman and let her know that he or his nurse is not in today and she states that is fine when ever they come in to please give her a call. Please contact pt

## 2016-02-26 ENCOUNTER — Ambulatory Visit (INDEPENDENT_AMBULATORY_CARE_PROVIDER_SITE_OTHER): Payer: Medicare Other | Admitting: Gastroenterology

## 2016-02-26 ENCOUNTER — Encounter: Payer: Self-pay | Admitting: Gastroenterology

## 2016-02-26 VITALS — BP 187/89 | HR 82 | Temp 99.3°F | Ht 64.0 in | Wt 128.0 lb

## 2016-02-26 DIAGNOSIS — C519 Malignant neoplasm of vulva, unspecified: Secondary | ICD-10-CM | POA: Diagnosis not present

## 2016-02-26 DIAGNOSIS — K6289 Other specified diseases of anus and rectum: Secondary | ICD-10-CM

## 2016-02-26 NOTE — Patient Instructions (Signed)
We have referred you to Dr. Whitney Muse.   I have also ordered a CT to further evaluate.   Take Miralax every other evening.   Further recommendations to follow!

## 2016-02-26 NOTE — Progress Notes (Signed)
Referring Provider: Octavio Graves, DO Primary Care Physician:  Octavio Graves, DO  Primary GI: Dr. Oneida Alar   Chief Complaint  Patient presents with  . Abdominal Pain  . Hemorrhoids    HPI:   Erin Mckay is a 82 y.o. female presenting today with a history of vulver cancer (2015)  s/p treatment with surgery and radiation. Last seen by Dr. Glo Herring July 2017 with evidence of small hyperkeratosis on right inner labia minora that will need excisional biopsy. The family is requesting referral to Dr. Whitney Muse. In the past, they have declined imaging but are willing to proceed with this now. She has a history of internal hemorrhoids s/p flex sig in Dec 2015 with grade 2-3 hemorrhoids s/p banding X 3. Felt to have an anal fissure when last seen Dec 2015. She underwent another flex sig in Jan 2016 found to have 3 small ulcers where prior banding had taken place. CT pelvis recommended for further evaluation due to her degree of pain. At that time she declined, but she would like to proceed now. Her daughters are present today. They note that she has pain with every bowel movement, and she has hemorrhoids that prolapse out.   Past Medical History  Diagnosis Date  . Hypertension   . Frequent UTI   . Hard of hearing   . Vision loss, bilateral   . Measles   . Vulvar cancer ([place]) 02/09/14  . Cataract     03-20-14 right eye" afraid to have surgery"  . Hemorrhoids     remains a problem.  . Vulvar cancer ([place]) 03/28/14  . 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 Tubal ligation    . Appendectomy    . Eye surgery Left     cataract removal and was blinded.  . Cataract extraction Left     "blinded" s/p cataract surgery, cataract on left -"afraid to have surgery"  . Vulvectomy Right 03/28/2014    Procedure: WIDE LOCAL EXCISION OF RIGHT VULVAR;  Surgeon: Everitt Amber, MD;  Location: [facility];  Service:  Gynecology;  Laterality: Right;  . Flexible sigmoidoscopy N/A 08/08/2014    Dr. Oneida Alar: moderate sized hemorrhoids Grade 2-3 with banding X 3. Mild diverticulosis in sigmoid colon  . Hemorrhoid banding N/A 08/08/2014    Procedure: HEMORRHOID BANDING;  Surgeon: Danie Binder, MD;  [facility];  Service: Endoscopy;  Laterality: N/A;  . Flexible sigmoidoscopy N/A 08/22/2014    3 small ulcers present where prior bands placed     Current Outpatient Prescriptions  Medication Sig Dispense Refill  . Ca Carbonate-Mag Hydroxide (ROLAIDS PO) Take 3 each by mouth as needed.    . docusate sodium (COLACE) 100 MG capsule Take 100 mg by mouth daily as needed.     Marland Kitchen HYDROcodone-acetaminophen (NORCO) 10-325 MG tablet Take 1 tablet by mouth every 4 (four) hours as needed.    . hydrocortisone (PROCTOZONE-HC) 2.5 % rectal cream Place 1 application rectally 2 (two) times daily. 30 g 3  . lisinopril (PRINIVIL,ZESTRIL) 40 MG tablet Take 40 mg by mouth at bedtime.     . polyethylene glycol (MIRALAX / GLYCOLAX) packet Take 17 g by mouth daily. Takes twice weekly    . silver sulfADIAZINE (SILVADENE) 1 % cream Apply 1 application topically daily. As directed    . simethicone (MYLICON) 0000000 MG chewable tablet Chew 125  mg by mouth every 6 (six) hours as needed for flatulence.    . mometasone (ELOCON) 0.1 % cream APPLY RECTALLY TWICE DAILY FOR 14 DAYS  0   No current facility-administered medications for this visit.    Allergies as of 02/26/2016 - Review Complete 02/26/2016  Allergen Reaction Noted  . Pineapple Swelling 03/17/2014  . Streptomycin  02/03/2014    Family History  Problem Relation Age of Onset  . Heart disease Mother   . Early death Father   . Anuerysm Sister     stomach  . Cancer Brother   . Hypertension Daughter   . Hyperlipidemia Daughter   . Cancer Son     bladder  . Heart disease Brother   . Hypertension Son   . Hypertension Son   . Hypertension Daughter   . Colon cancer Neg  Hx     Social History   Social History  . Marital Status: Widowed    Spouse Name: N/A  . Number of Children: N/A  . Years of Education: N/A   Social History Main Topics  . Smoking status: Current Every Day Smoker -- 1.00 packs/day for 48 years    Types: Cigarettes  . Smokeless tobacco: Former Systems developer     Comment: one pack daily  . Alcohol Use: No  . Drug Use: No  . Sexual Activity: Not Currently    Birth Control/ Protection: Post-menopausal   Other Topics Concern  . None   Social History Narrative    Review of Systems: As mentioned in HPI.   Physical Exam: BP 187/89 mmHg  Pulse 82  Temp(Src) 99.3 F (37.4 C)  Ht 5\' 4"  (1.626 m)  Wt 128 lb (58.06 kg)  BMI 21.96 kg/m2 General:   Alert and oriented. No distress noted. Pleasant and cooperative.  Head:  Normocephalic and atraumatic. Eyes:  Conjuctiva clear without scleral icterus. Rectal: No evidence of thrombosed hemorrhoids. Small hemorrhoidal tags. Declined internal exam.  Extremities:  Without edema. Neurologic:  Alert and  oriented x4 Psych:  Alert and cooperative. Normal mood and affect.

## 2016-02-27 NOTE — Telephone Encounter (Signed)
Pt's daughter states that Dr. Oneida Alar has taken care of the referral to Dr. Whitney Muse. Pt's daughter states that Dr.Fields also ordered a CT scan. Pt's daughter stated that she was not sure they wanted to proceed with a biopsy at this time. Pt's daughter aware to call us if anything changes.

## 2016-02-28 NOTE — Progress Notes (Signed)
cc'ed to pcp °

## 2016-02-28 NOTE — Assessment & Plan Note (Signed)
Refer to Dr. Whitney Muse for further evaluation. With patient's age, palliative options most prudent. Pain control seems to be most challenging hurdle. History of vulvar cancer s/p treatment, now with concern for recurrence. CT abd/pelvis ordered.

## 2016-02-28 NOTE — Assessment & Plan Note (Signed)
No obvious rectal prolapse on exam. Miralax every other evening. Avoid constipation. Use rectal cream as prescribed.

## 2016-03-03 ENCOUNTER — Other Ambulatory Visit: Payer: Self-pay

## 2016-03-03 DIAGNOSIS — C519 Malignant neoplasm of vulva, unspecified: Secondary | ICD-10-CM

## 2016-03-04 MED ORDER — LIDOCAINE 5 % EX OINT: 1.0000 "application " | TOPICAL_OINTMENT | CUTANEOUS | Status: DC | PRN

## 2016-03-04 NOTE — Telephone Encounter (Signed)
Spoke with Erin Mckay, Mr. [name] daughter and explained to her that Dr. Whitney Muse is a medical oncologist rather than's gynecologic surgical oncologist such as Dr. Denman George. Have advised daughter to have Erin Mckay seen by either me or Dr. Denman George for consideration of biopsy. Patient agrees to this plan and will schedule at our office for appointment for vulvar biopsy. Will call in lidocaine ointment to try to help with the constant discomfort. Patient is been unable to be relieved by Silvadene cream or Desitin

## 2016-03-05 ENCOUNTER — Ambulatory Visit (HOSPITAL_COMMUNITY)
Admission: RE | Admit: 2016-03-05 | Discharge: 2016-03-05 | Disposition: A | Discharge status: Home / Self Care | Payer: Medicare Other | Source: Ambulatory Visit | Attending: Gastroenterology | Admitting: Gastroenterology

## 2016-03-05 DIAGNOSIS — K4090 Unilateral inguinal hernia, without obstruction or gangrene, not specified as recurrent: Secondary | ICD-10-CM | POA: Diagnosis not present

## 2016-03-05 DIAGNOSIS — C519 Malignant neoplasm of vulva, unspecified: Secondary | ICD-10-CM | POA: Diagnosis not present

## 2016-03-05 DIAGNOSIS — K8020 Calculus of gallbladder without cholecystitis without obstruction: Secondary | ICD-10-CM | POA: Diagnosis not present

## 2016-03-05 DIAGNOSIS — I714 Abdominal aortic aneurysm, without rupture: Secondary | ICD-10-CM | POA: Diagnosis not present

## 2016-03-05 DIAGNOSIS — K6289 Other specified diseases of anus and rectum: Secondary | ICD-10-CM | POA: Diagnosis not present

## 2016-03-05 DIAGNOSIS — K449 Diaphragmatic hernia without obstruction or gangrene: Secondary | ICD-10-CM | POA: Insufficient documentation

## 2016-03-05 LAB — POCT I-STAT CREATININE: CREATININE: 0.9 mg/dL (ref 0.44–1.00)

## 2016-03-05 MED ORDER — IOPAMIDOL (ISOVUE-300) INJECTION 61%
100.0000 mL | Freq: Once | INTRAVENOUS | Status: AC | PRN
  Administered 2016-03-05: 100 mL via INTRAVENOUS

## 2016-03-10 ENCOUNTER — Ambulatory Visit (INDEPENDENT_AMBULATORY_CARE_PROVIDER_SITE_OTHER): Payer: Medicare Other | Admitting: Obstetrics and Gynecology

## 2016-03-10 ENCOUNTER — Telehealth: Payer: Self-pay | Admitting: Gastroenterology

## 2016-03-10 ENCOUNTER — Encounter: Payer: Self-pay | Admitting: Obstetrics and Gynecology

## 2016-03-10 VITALS — BP 130/70 | Ht 60.0 in | Wt 130.0 lb

## 2016-03-10 DIAGNOSIS — Z8544 Personal history of malignant neoplasm of other female genital organs: Secondary | ICD-10-CM

## 2016-03-10 DIAGNOSIS — L292 Pruritus vulvae: Secondary | ICD-10-CM

## 2016-03-10 DIAGNOSIS — N898 Other specified noninflammatory disorders of vagina: Secondary | ICD-10-CM

## 2016-03-10 NOTE — Telephone Encounter (Signed)
LMOM that I will let Vicente Males know tomorrow when she returns that they have inquired about the CT results.  I also reminded them that we have 7-10 business days to call with results. Please let us know if there is a problem.

## 2016-03-10 NOTE — Telephone Encounter (Signed)
PATIENT DAUGHTER CALLED INQUIRING ABOUT CT RESULTS FROM LAST WEEK

## 2016-03-10 NOTE — Progress Notes (Signed)
[facility] Visit  @DATE @            Patient name: Erin Mckay MRN [id number]  Date of birth: January 31, 1924  CC & HPI:  Erin Mckay is a 82 y.o. female presenting today for vulvar biopsy. Patient with history of vulvar cancer ([facility]) s/p radiation in the past.  Reports itching. Has been using mometasone cream.   ROS:  Review of Systems  Constitutional: Negative for chills and fever.  Respiratory: Negative for cough.   Cardiovascular: Negative for chest pain and leg swelling.  Gastrointestinal: Negative for abdominal pain.  Genitourinary: Negative for dysuria.  Skin: Positive for itching. Negative for rash.       vulvar     Pertinent History Reviewed:   Medical         Past Medical History:  Diagnosis Date  . Cataract    03-20-14 right eye" afraid to have surgery"  . 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 Frequent UTI   . Hard of hearing   . Hemorrhoids    remains a problem.  . Hypertension   . Measles   . Vision loss, bilateral   . Vulvar cancer ([place]) 02/09/14  . Vulvar cancer ([place]) 03/28/14                              Surgical Hx:    Past Surgical History:  Procedure Laterality Date  . APPENDECTOMY    . CATARACT EXTRACTION Left    "blinded" s/p cataract surgery, cataract on left -"afraid to have surgery"  . EYE SURGERY Left    cataract removal and was blinded.  Marland Kitchen FLEXIBLE SIGMOIDOSCOPY N/A 08/08/2014   Dr. Oneida Alar: moderate sized hemorrhoids Grade 2-3 with banding X 3. Mild diverticulosis in sigmoid colon  . FLEXIBLE SIGMOIDOSCOPY N/A 08/22/2014   3 small ulcers present where prior bands placed   . HEMORRHOID BANDING N/A 08/08/2014   Procedure: HEMORRHOID BANDING;  Surgeon: Danie Binder, MD;  [facility];  Service: Endoscopy;  Laterality: N/A;  . TUBAL LIGATION    . VULVECTOMY Right 03/28/2014   Procedure: WIDE LOCAL EXCISION OF RIGHT VULVAR;  Surgeon: Everitt Amber, MD;  Location: [facility];  Service:  Gynecology;  Laterality: Right;   Medications: Reviewed & Updated - see associated section                       Current Outpatient Prescriptions:  .  Ca Carbonate-Mag Hydroxide (ROLAIDS PO), Take 3 each by mouth as needed., Disp: , Rfl:  .  docusate sodium (COLACE) 100 MG capsule, Take 100 mg by mouth daily as needed. , Disp: , Rfl:  .  HYDROcodone-acetaminophen (NORCO) 10-325 MG tablet, Take 1 tablet by mouth every 4 (four) hours as needed., Disp: , Rfl:  .  hydrocortisone (PROCTOZONE-HC) 2.5 % rectal cream, Place 1 application rectally 2 (two) times daily., Disp: 30 g, Rfl: 3 .  lidocaine (XYLOCAINE) 5 % ointment, Apply 1 application topically as needed., Disp: 35.44 g, Rfl: 0 .  lisinopril (PRINIVIL,ZESTRIL) 40 MG tablet, Take 40 mg by mouth at bedtime. , Disp: , Rfl:  .  mometasone (ELOCON) 0.1 % cream, APPLY RECTALLY TWICE DAILY FOR 14 DAYS, Disp: , Rfl: 0 .  polyethylene glycol (MIRALAX / GLYCOLAX) packet, Take 17 g by mouth daily. Takes twice weekly, Disp: , Rfl:  .  silver sulfADIAZINE (SILVADENE) 1 % cream, Apply 1 application topically daily. As directed, Disp: , Rfl:  .  simethicone (MYLICON) 0000000 MG chewable tablet, Chew 125 mg by mouth every 6 (six) hours as needed for flatulence., Disp: , Rfl:    Social History: Reviewed -  reports that she has been smoking Cigarettes.  She has a 48.00 pack-year smoking history. She has quit using smokeless tobacco.  Objective Findings:  Vitals: Blood pressure 130/70, height 5' (1.524 m), weight 130 lb (59 kg).  Physical Examination: General appearance - alert, well appearing elderly lady, and in no distress Resp: no increased work of breathing Abdomen: non tender GU: no apparent perineal lesion, vulvar fissure at 12 o'clock position, linear scaring over inner aspect of her right labia from previous radiation, no other notable lesion to be biopsed . The area of previously noted hyperkeratosis is smoother, without bleeding or  excoriation.   Assessment & Plan:   A: Erin Mckay is a 82 years old female with history of vulvar cancer s/p radiation therapy who presents for biopsy. GU exam is reassuring today. There is no apparent lesion for biopsy. She has vaginal/vulvar fissusre at 12 o'clock position. Finding discussed with daughter who is in agreement with holding off biopsy at this time. Daughter will monitor for change and return as needed.  P:  1. Daughter to monitor for change or new finding and bring the patient back 2. Continue using mometasone cream as needed for itching.

## 2016-03-12 NOTE — Progress Notes (Signed)
No obvious rectal mass on CT or obvious inflammation. She has a large hiatal hernia. She also has a large left inguinal hernia that contains part of her large intestine and right inguinal hernia with part of her small intestine but no obstruction. She has an abdominal aortic aneurysm. She is not a candidate for surgery for any of these things. It is important that she avoids constipation as we talked about at the visit.

## 2016-03-12 NOTE — Telephone Encounter (Signed)
I was on vacation last week when this came in. Please let them know that.  I have addressed the results.

## 2016-03-12 NOTE — Progress Notes (Signed)
LMOM for a return call.  

## 2016-03-12 NOTE — Telephone Encounter (Signed)
See result note. I have LMOM for a return call.

## 2016-03-13 NOTE — Progress Notes (Signed)
Agree. Take it daily to BID if needed. We can start some prescription medication such as Amitiza if she is willing. This may be a good thing to try. We can leave samples of Amitiza 8 mcg to take twice a day with food. I feel something such as Amitiza or Linzess would be a better option than Miralax so we aren't playing "catch up" all the time and we are going straight to the source of the issue.

## 2016-03-13 NOTE — Progress Notes (Signed)
Pt's daughter, Velva Harman, is aware. She said her mom has not had a good BM in a week. She poops just a little and stops because she hurts so bad. She has been giving her Miralax every other day. I told her she needs it daily if she is not having good BM's. Routing to Laban Emperor, NP to advise!

## 2016-03-13 NOTE — Progress Notes (Signed)
Erin Mckay is aware. She wants to do the Miralax bid and cut back if needed. She said her mom tried Amitiza and it made her sick on her stomach and it would work too good sometimes. She will let us know how the miralax does and if there are still problems.

## 2016-03-21 ENCOUNTER — Telehealth: Payer: Self-pay | Admitting: Gastroenterology

## 2016-03-21 ENCOUNTER — Other Ambulatory Visit: Payer: Self-pay | Admitting: Gastroenterology

## 2016-03-21 MED ORDER — LINACLOTIDE 72 MCG PO CAPS: 72.0000 ug | ORAL_CAPSULE | Freq: Every day | ORAL | 5 refills | Status: DC

## 2016-03-21 NOTE — Telephone Encounter (Signed)
I spoke to pt's daughter, Velva Harman, and she said she has been giving her mom stool softner daily, miralax daily and if no BM giving a second dose. Yesterday she gave the second dose and still no BM. Pt has just a same BM this AM but she is in pain all of the time. Her pain medicine has been increased and they were warned that might be an additional problem. Pt has tried Amitiza 24 mcg before and that did not work. Please advise!

## 2016-03-21 NOTE — Telephone Encounter (Signed)
Erin Mckay is aware of the instructions.  She would like for you to send the prescription to [facility] in [place].

## 2016-03-21 NOTE — Telephone Encounter (Signed)
[phone]   PATIENT DAUGHTER CALLED AND STATED THAT SHE IS NOT GOING TO THE BATHROOM GOOD.  IS THERE SOMETHING THAT SHE CAN TAKE OTHER THAN MIRALAX.  HAD A GOOD BM A FEW DAYS AGO

## 2016-03-21 NOTE — Telephone Encounter (Signed)
Let's trial Linzess 72 mcg once each morning, 30 minutes before breakfast.  If this does not work, we can trial 145 mcg once each morning.   Linzess will take the place of Miralax and stool softeners. They can pick up samples of both and start with the 72 dosing so that they are not running back and forth to the office.

## 2016-03-21 NOTE — Addendum Note (Signed)
Addended by: Orvil Feil on: 03/21/2016 12:08 PM   Modules accepted: Orders

## 2016-03-21 NOTE — Telephone Encounter (Signed)
Linzess 72 mcg sent to pharmacy. If 145 is needed, she can trial samples first to save money, and we can go from there.

## 2016-03-31 DIAGNOSIS — R350 Frequency of micturition: Secondary | ICD-10-CM | POA: Diagnosis not present

## 2016-04-15 ENCOUNTER — Telehealth: Payer: Self-pay | Admitting: Gastroenterology

## 2016-04-15 NOTE — Telephone Encounter (Signed)
Erin Mckay is aware. I am leaving samples of Linzess 145 mcg and 290 mcg at front. She knows to give 145 mcg daily for one week, before trying the 290.  Pt has appt at cancer center tomorrow. OV here on 05/12/2016 at 11:00 Am with Roseanne Kaufman, NP.

## 2016-04-15 NOTE — Telephone Encounter (Signed)
We can do the Linzess 145 mcg once daily. Probably needs an office appointment. May leave samples up at the front for her. There is a medication that we could do instead of Linzess that is for opioid-induced constipation. Pain patches would still be constipation but may have better compliance with patient since wouldn't have to have her take it by mouth all the time. It would minimize how many pills she would have to take. In this case, quality of life would best and limit how much she is having to take on a routine basis.   In regards to stronger dosage: yes, the Linzess comes in the 3 dosages. I thought we gave 145 mcg samples of Linzess? If not, let's give the 145 mcg samples AND 290 mcg samples and have her try the 145 mcg samples first for a week. If needed, can increase to 290 mcg samples.  What happened to referral to Dr. Whitney Muse?

## 2016-04-15 NOTE — Telephone Encounter (Signed)
PATIENT DAUGHTER, RITA, CALLED AND STATED THAT THE LINZESS DID NOT WORK AND SHE STARTED GIVING HER MIRILAX AGAIN.  DO THEY MAKE A STRONGER MEDICATION SHE CAN TRY? [phone]

## 2016-04-15 NOTE — Telephone Encounter (Signed)
I spoke to [place]. She said she just does not know what is going to have to be done for her mom.  The Linzess 72 mcg did not work. She has put her mom back on Miralax qd and she has a BM about every 3 days. She has done Miralax bid but a little hard with the pt's dementia.  Velva Harman said pt wakes up every morning complaining of her bottom on fire, and also her vulva area. She has a lot of pain and is given pain meds by PCP. She just wanted to ask Roseanne Kaufman, NP, if pain patches would be as constipating as the pain pills. She will discuss with PCP if Vicente Males thinks that is a good idea. Rita sounds very distraught with being the sole caregiver and dealing with all of the pt's health issues and dementia. She said she will appreciate any recommendations/ advice that Vicente Males can give.

## 2016-04-16 ENCOUNTER — Ambulatory Visit (HOSPITAL_COMMUNITY): Payer: Medicare Other | Admitting: Hematology

## 2016-04-16 ENCOUNTER — Other Ambulatory Visit: Payer: Self-pay

## 2016-04-16 ENCOUNTER — Encounter: Payer: Self-pay | Admitting: Gastroenterology

## 2016-04-16 ENCOUNTER — Ambulatory Visit (INDEPENDENT_AMBULATORY_CARE_PROVIDER_SITE_OTHER): Payer: Medicare Other | Admitting: Gastroenterology

## 2016-04-16 VITALS — BP 192/85 | HR 81 | Temp 98.3°F | Ht 64.0 in | Wt 127.8 lb

## 2016-04-16 DIAGNOSIS — K640 First degree hemorrhoids: Secondary | ICD-10-CM | POA: Diagnosis not present

## 2016-04-16 DIAGNOSIS — K6289 Other specified diseases of anus and rectum: Secondary | ICD-10-CM

## 2016-04-16 DIAGNOSIS — K649 Unspecified hemorrhoids: Secondary | ICD-10-CM

## 2016-04-16 MED ORDER — HYDROCORTISONE 2.5 % RE CREA: 1.0000 "application " | TOPICAL_CREAM | Freq: Two times a day (BID) | RECTAL | 3 refills | Status: DC

## 2016-04-16 NOTE — Progress Notes (Signed)
Referring Provider: Octavio Graves, DO Primary Care Physician:  Octavio Graves, DO  Primary GI: Dr. Oneida Alar   Chief Complaint  Patient presents with  . Constipation  . Hemorrhoids    no bleeding    HPI:   Erin Mckay is a 82 y.o. female presenting today with a history of vulvar cancer in 2015 s/p treatment with surgery and radiation. Last seen by Dr. Glo Herring in July 2017 for vulvar biopsy but no apparent lesion was noted. She had a vaginal/vulvar fissure at the 12 o'clock position. Hold on biopsy at that time. She has been referred to Dr. Whitney Muse just to establish care. Imaging done in the interim without evidence of disease. History of internal hemorrhoids  s/p flex sig in Dec 2015 with grade 2-3 hemorrhoids s/p banding X 3. Felt to have an anal fissure when last seen Dec 2015. She underwent another flex sig in Jan 2016 found to have 3 small ulcers where prior banding had taken place. Last seen July 2017. She continues to note significant rectal pain. Wakes with rectal discomfort. No obvious rectal prolapse on exam, daughter describes what sounds to be prolapsing internal hemorrhoids but I have not appreciated this in the office. Could be having intermittent rectal prolapse. Patient with dementia and confusion intermittently. On chronic narcotics through PCP, which daughter is trying to limit. We have tried multiple therapies.   [facility] cream didn't help. Hydrocortisone cream works the best. Linzess 72 mcg not strong enough. Has panic attacks with pain pills. Burns in vagina when constipated. Diltiazem gel and nitro cream have been attempted for patient historically when occult anal fissure was suspected but this was unable to be afforded. Per daughter, the only cream helpful was hydrocortisone.   Past Medical History:  Diagnosis Date  . Cataract    03-20-14 right eye" afraid to have surgery"  . Complication of anesthesia    hard to wake up  memory loss after  valvectomy  . Frequent UTI   . Hard of hearing   . Hemorrhoids    remains a problem.  . Hypertension   . Measles   . Vision loss, bilateral   . Vulvar cancer ([place]) 02/09/14  . Vulvar cancer ([place]) 03/28/14    Past Surgical History:  Procedure Laterality Date  . APPENDECTOMY    . CATARACT EXTRACTION Left    "blinded" s/p cataract surgery, cataract on left -"afraid to have surgery"  . EYE SURGERY Left    cataract removal and was blinded.  Marland Kitchen FLEXIBLE SIGMOIDOSCOPY N/A 08/08/2014   Dr. Oneida Alar: moderate sized hemorrhoids Grade 2-3 with banding X 3. Mild diverticulosis in sigmoid colon  . FLEXIBLE SIGMOIDOSCOPY N/A 08/22/2014   3 small ulcers present where prior bands placed   . HEMORRHOID BANDING N/A 08/08/2014   Procedure: HEMORRHOID BANDING;  Surgeon: Danie Binder, MD;  [facility];  Service: Endoscopy;  Laterality: N/A;  . TUBAL LIGATION    . VULVECTOMY Right 03/28/2014   Procedure: WIDE LOCAL EXCISION OF RIGHT VULVAR;  Surgeon: Everitt Amber, MD;  Location: [facility];  Service: Gynecology;  Laterality: Right;    Current Outpatient Prescriptions  Medication Sig Dispense Refill  . Ca Carbonate-Mag Hydroxide (ROLAIDS PO) Take 3 each by mouth as needed.    . docusate sodium (COLACE) 100 MG capsule Take 100 mg by mouth daily as needed.     Marland Kitchen HYDROcodone-acetaminophen (NORCO) 10-325 MG tablet Take 1 tablet by mouth every 4 (four) hours as  needed.    . hydrocortisone (PROCTOZONE-HC) 2.5 % rectal cream Place 1 application rectally 2 (two) times daily. 30 g 3  . lisinopril (PRINIVIL,ZESTRIL) 40 MG tablet Take 40 mg by mouth at bedtime.     . polyethylene glycol (MIRALAX / GLYCOLAX) packet Take 17 g by mouth daily.     . simethicone (MYLICON) 0000000 MG chewable tablet Chew 125 mg by mouth every 6 (six) hours as needed for flatulence.    . lidocaine (XYLOCAINE) 5 % ointment Apply 1 application topically as needed. (Patient not taking: Reported on 04/16/2016) 35.44 g 0  . linaclotide  (LINZESS) 72 MCG capsule Take 1 capsule (72 mcg total) by mouth daily before breakfast. (Patient not taking: Reported on 04/16/2016) 30 capsule 5  . mometasone (ELOCON) 0.1 % cream APPLY RECTALLY TWICE DAILY FOR 14 DAYS  0  . silver sulfADIAZINE (SILVADENE) 1 % cream Apply 1 application topically daily. As directed     No current facility-administered medications for this visit.     Allergies as of 04/16/2016 - Review Complete 04/16/2016  Allergen Reaction Noted  . Pineapple Swelling 03/17/2014  . Streptomycin  02/03/2014    Family History  Problem Relation Age of Onset  . Heart disease Mother   . Early death Father   . Anuerysm Sister     stomach  . Cancer Brother   . Hypertension Daughter   . Hyperlipidemia Daughter   . Cancer Son     bladder  . Heart disease Brother   . Hypertension Son   . Hypertension Son   . Hypertension Daughter   . Colon cancer Neg Hx     Social History   Social History  . Marital status: Widowed    Spouse name: N/A  . Number of children: N/A  . Years of education: N/A   Social History Main Topics  . Smoking status: Current Every Day Smoker    Packs/day: 1.00    Years: 48.00    Types: Cigarettes  . Smokeless tobacco: Former Systems developer     Comment: one pack daily  . Alcohol use No  . Drug use: No  . Sexual activity: Not Currently    Birth control/ protection: Post-menopausal   Other Topics Concern  . None   Social History Narrative  . None    Review of Systems: Limited due to cognitive status.   Physical Exam: BP (!) 192/85   Pulse 81   Temp 98.3 F (36.8 C) (Oral)   Ht 5\' 4"  (1.626 m)   Wt 127 lb 12.8 oz (58 kg)   BMI 21.94 kg/m  General:   Alert and oriented to person and place.  Head:  Normocephalic and atraumatic. Rectal: no obvious anal fissure. Internal exam with questionable prolapsingweakness of anterior rectal wall, unable to appreciate any obvious rectal prolapse.  Extremities:  Without edema. Psych:  Alert and  cooperative. Normal mood and affect.

## 2016-04-16 NOTE — Patient Instructions (Signed)
I have refilled the cream for you.  I am referring you to Dr. Rhoderick Moody for an evaluation.  If you have to take an Ibuprofen, take Prilosec with it. I would limit this significantly and only use sparingly. I would avoid this if possible and only use in significant cases. We want to avoid stomach irritation and risk of GI upset.   For constipation: start Linzess 145 mcg once daily, 30 minutes before breakfast. If this does not work, increase to 290 mcg. We have provided samples. Let me know which one works for you

## 2016-04-17 ENCOUNTER — Telehealth: Payer: Self-pay

## 2016-04-17 NOTE — Telephone Encounter (Signed)
open

## 2016-04-21 ENCOUNTER — Telehealth: Payer: Self-pay | Admitting: Gastroenterology

## 2016-04-21 NOTE — Assessment & Plan Note (Signed)
History of vulvar cancer in 2015 s/p treatment with surgery/radiation. No evidence of recurrence as of last visit in July 2017. Persistent rectal pain despite creams; unable to afford diltiazem gel and nitro ointment historically for empiric treatment of possible occult anal fissure. Prior banding of internal hemorrhoids in Dec 2015. With history of dementia, difficult to obtain history. Daughter is exhausted and quality of life is affected. Need to optimize constipation regimen: increase Linzess to 145 mcg once daily. I have also included 290 mcg samples as well. At this point, I feel she needs to see general surgery for a discussion, as we have limited options on what to do at this point. Will attempt nitro ointment again per rectum to see if we can provide any relief in case we are dealing with an occult anal fissure. Continue hydrocortisone BID, which seems to be the only therapy that has worked for her.

## 2016-04-21 NOTE — Telephone Encounter (Signed)
Would Erin Mckay be willing to try the nitro ointment again per rectum twice a day? This has been somewhat pricey in the past. However, if she has an anal fissure, this would help heal/treat this. I can send in to the pharmacy to see what the price is. Just let me know. This is what I recommend while we are waiting for surgical evaluation.

## 2016-04-22 NOTE — Progress Notes (Signed)
cc'ed to pcp °

## 2016-04-22 NOTE — Telephone Encounter (Signed)
I called and spoke to [place]. She said to please send it in to [facility]. She said last time the pharmacist got it covered under Medicaid and she will see if they can do so again.

## 2016-04-23 ENCOUNTER — Telehealth: Payer: Self-pay | Admitting: Gastroenterology

## 2016-04-23 NOTE — Telephone Encounter (Signed)
See today's phone note.  

## 2016-04-23 NOTE — Telephone Encounter (Signed)
PT DAUGHTER CALLED AND STATED THAT THE PRESCRIPTION FOR THE CREAM IS NOT AT THE PHARMACY THAT WAS DISCUSSED YESTERDAY.

## 2016-04-23 NOTE — Telephone Encounter (Signed)
I called in [facility] cream with nitro. She will need to use gloves when applying, monitor for headache, wash hands after using.  It will take some time to be compounded so she needs to wait until they contact her to pick it up. SO SORRY for the confusion. It should be ready this afternoon.

## 2016-04-23 NOTE — Telephone Encounter (Signed)
Noted and taken care of.

## 2016-04-23 NOTE — Telephone Encounter (Signed)
Erin Mckay, did you send the Rx for the cream to [facility]?

## 2016-04-23 NOTE — Telephone Encounter (Signed)
PT's daughter Velva Harman is aware.

## 2016-04-23 NOTE — Telephone Encounter (Signed)
[facility] cream will not be covered.   However, I have asked them JUST to do the Nitro-Bid. She will use this twice a day, alternating with the hydrocortisone. Still needs to wear gloves when applying. They will call her when ready. THIS should be covered.

## 2016-04-30 ENCOUNTER — Ambulatory Visit (HOSPITAL_COMMUNITY): Payer: Medicare Other | Admitting: Hematology

## 2016-04-30 ENCOUNTER — Telehealth: Payer: Self-pay | Admitting: Hematology

## 2016-04-30 NOTE — Telephone Encounter (Signed)
Lt mess for [facility] regarding pt calling to reschedule the appt for 9/13 at 1:20, asked to call Diane Fult at [phone] to reschedule.

## 2016-05-05 DIAGNOSIS — K5900 Constipation, unspecified: Secondary | ICD-10-CM | POA: Diagnosis not present

## 2016-05-05 DIAGNOSIS — F0390 Unspecified dementia without behavioral disturbance: Secondary | ICD-10-CM | POA: Diagnosis not present

## 2016-05-05 DIAGNOSIS — Z79899 Other long term (current) drug therapy: Secondary | ICD-10-CM | POA: Diagnosis not present

## 2016-05-05 DIAGNOSIS — F29 Unspecified psychosis not due to a substance or known physiological condition: Secondary | ICD-10-CM | POA: Diagnosis not present

## 2016-05-05 DIAGNOSIS — F0391 Unspecified dementia with behavioral disturbance: Secondary | ICD-10-CM | POA: Diagnosis not present

## 2016-05-06 ENCOUNTER — Telehealth: Payer: Self-pay

## 2016-05-06 NOTE — Telephone Encounter (Signed)
[name] office called. Pt didn't show up for her appt. They were unable to get in touch with pt. Called and spoke with daughter, she forgot about appt. Gave her phone number to [name] office and she will reschedule.

## 2016-05-12 ENCOUNTER — Ambulatory Visit: Payer: Medicare Other | Admitting: Gastroenterology

## 2016-05-15 ENCOUNTER — Telehealth: Payer: Self-pay

## 2016-05-15 NOTE — Telephone Encounter (Signed)
pts daughter, Olam Idler called, pt tried the linzess 60mcg and it didn't work but the 167mcg did seem to work. They are asking for an rx sent in to her pharmacy.

## 2016-05-16 MED ORDER — LINACLOTIDE 145 MCG PO CAPS: 145.0000 ug | ORAL_CAPSULE | Freq: Every day | ORAL | 3 refills | Status: DC

## 2016-05-16 NOTE — Telephone Encounter (Signed)
Completed.

## 2016-05-18 ENCOUNTER — Encounter (HOSPITAL_COMMUNITY): Payer: Self-pay | Admitting: Emergency Medicine

## 2016-05-18 ENCOUNTER — Emergency Department (HOSPITAL_COMMUNITY)
Admission: EM | Admit: 2016-05-18 | Discharge: 2016-05-20 | Disposition: A | Discharge status: Other Acute Inpatient Hospital | Payer: Medicare Other | Attending: Emergency Medicine | Admitting: Emergency Medicine

## 2016-05-18 ENCOUNTER — Emergency Department (HOSPITAL_COMMUNITY): Payer: Medicare Other

## 2016-05-18 DIAGNOSIS — Z79899 Other long term (current) drug therapy: Secondary | ICD-10-CM | POA: Insufficient documentation

## 2016-05-18 DIAGNOSIS — F17210 Nicotine dependence, cigarettes, uncomplicated: Secondary | ICD-10-CM | POA: Diagnosis not present

## 2016-05-18 DIAGNOSIS — R402411 Glasgow coma scale score 13-15, in the field [EMT or ambulance]: Secondary | ICD-10-CM | POA: Diagnosis not present

## 2016-05-18 DIAGNOSIS — I10 Essential (primary) hypertension: Secondary | ICD-10-CM | POA: Insufficient documentation

## 2016-05-18 DIAGNOSIS — F23 Brief psychotic disorder: Secondary | ICD-10-CM

## 2016-05-18 DIAGNOSIS — R4182 Altered mental status, unspecified: Secondary | ICD-10-CM | POA: Diagnosis not present

## 2016-05-18 LAB — URINALYSIS, ROUTINE W REFLEX MICROSCOPIC
GLUCOSE, UA: NEGATIVE mg/dL
NITRITE: NEGATIVE
Specific Gravity, Urine: 1.025 (ref 1.005–1.030)
pH: 6 (ref 5.0–8.0)

## 2016-05-18 LAB — RAPID URINE DRUG SCREEN, HOSP PERFORMED
Amphetamines: NOT DETECTED
BARBITURATES: NOT DETECTED
BENZODIAZEPINES: NOT DETECTED
COCAINE: NOT DETECTED
OPIATES: POSITIVE — AB
Tetrahydrocannabinol: NOT DETECTED

## 2016-05-18 LAB — URINE MICROSCOPIC-ADD ON

## 2016-05-18 LAB — I-STAT CG4 LACTIC ACID, ED: Lactic Acid, Venous: 1.16 mmol/L (ref 0.5–1.9)

## 2016-05-18 LAB — COMPREHENSIVE METABOLIC PANEL
ALBUMIN: 3.3 g/dL — AB (ref 3.5–5.0)
ALT: 17 U/L (ref 14–54)
AST: 23 U/L (ref 15–41)
Alkaline Phosphatase: 60 U/L (ref 38–126)
Anion gap: 8 (ref 5–15)
BUN: 16 mg/dL (ref 6–20)
CHLORIDE: 104 mmol/L (ref 101–111)
CO2: 26 mmol/L (ref 22–32)
CREATININE: 0.66 mg/dL (ref 0.44–1.00)
Calcium: 9.6 mg/dL (ref 8.9–10.3)
GFR calc non Af Amer: >60 mL/min (ref >60)
Glucose, Bld: 97 mg/dL (ref 65–99)
POTASSIUM: 3.5 mmol/L (ref 3.5–5.1)
SODIUM: 138 mmol/L (ref 135–145)
Total Bilirubin: 0.9 mg/dL (ref 0.3–1.2)
Total Protein: 6.9 g/dL (ref 6.5–8.1)

## 2016-05-18 LAB — CBC WITH DIFFERENTIAL/PLATELET
BASOS ABS: 0 10*3/uL (ref 0.0–0.1)
BASOS PCT: 0 %
EOS ABS: 0 10*3/uL (ref 0.0–0.7)
EOS PCT: 0 %
HCT: 39 % (ref 36.0–46.0)
Hemoglobin: 12.8 g/dL (ref 12.0–15.0)
Lymphocytes Relative: 7 %
Lymphs Abs: 0.6 10*3/uL — ABNORMAL LOW (ref 0.7–4.0)
MCH: 29.6 pg (ref 26.0–34.0)
MCHC: 32.8 g/dL (ref 30.0–36.0)
MCV: 90.1 fL (ref 78.0–100.0)
MONO ABS: 0.5 10*3/uL (ref 0.1–1.0)
MONOS PCT: 7 %
NEUTROS ABS: 7 10*3/uL (ref 1.7–7.7)
Neutrophils Relative %: 86 %
PLATELETS: 229 10*3/uL (ref 150–400)
RBC: 4.33 MIL/uL (ref 3.87–5.11)
RDW: 12.8 % (ref 11.5–15.5)
WBC: 8.2 10*3/uL (ref 4.0–10.5)

## 2016-05-18 LAB — ETHANOL: Alcohol, Ethyl (B): <5 mg/dL (ref <5)

## 2016-05-18 LAB — TSH: TSH: 0.343 u[IU]/mL — AB (ref 0.350–4.500)

## 2016-05-18 LAB — TROPONIN I

## 2016-05-18 MED ORDER — LISINOPRIL 10 MG PO TABS: 40.0000 mg | ORAL_TABLET | Freq: Every day | ORAL | Status: DC

## 2016-05-18 MED ORDER — HYDROCODONE-ACETAMINOPHEN 10-325 MG PO TABS
1.0000 | ORAL_TABLET | ORAL | Status: DC | PRN
  Administered 2016-05-19 – 2016-05-20 (×3): 1 via ORAL
  Filled 2016-05-18 (×3): qty 1

## 2016-05-18 MED ORDER — LINACLOTIDE 145 MCG PO CAPS
145.0000 ug | ORAL_CAPSULE | Freq: Every day | ORAL | Status: DC
  Administered 2016-05-19 – 2016-05-20 (×2): 145 ug via ORAL
  Filled 2016-05-18 (×3): qty 1

## 2016-05-18 MED ORDER — ZIPRASIDONE MESYLATE 20 MG IM SOLR
10.0000 mg | Freq: Once | INTRAMUSCULAR | Status: AC
  Administered 2016-05-18: 10 mg via INTRAMUSCULAR

## 2016-05-18 NOTE — ED Notes (Signed)
Fed patient and now pt is stating she is wanting to sleep. Gave pt a warm blanket and now she is resting comfortably.

## 2016-05-18 NOTE — BHH Counselor (Signed)
Writer faxed referrals to following facilities (per TTS CSW, these facilities have beds):  [facility], [facility]

## 2016-05-18 NOTE — ED Notes (Deleted)
Pt discharged to [facility] for transfer to [facility].

## 2016-05-18 NOTE — ED Provider Notes (Signed)
Nelson recommends inpatient geriatric psych   Carmin Muskrat, MD 05/18/16 1606

## 2016-05-18 NOTE — BH Assessment (Signed)
Tele Assessment Note  Pt presents voluntarily to [facility]. Pt's daughters are bedside and they provide collateral info. Pt is not oriented at all. She is restless and agitated. Pt poor historian as she doesn't answer questions asked and is paranoid. At start of teleassessment, pt addresses Probation officer by saying, "She's crazy. She wants to hurt me." Pt is referring to this Probation officer. Pt's speech is loud, tangential and circumstantial. Pt repeats the phrase, "My tail is killing me" and "My tail hurts". Per chart history, pt has had hemhorrhoid surgery. Pt tells writer that her daughters "are telling lies" about pt.  Collateral info provided by daughter Erin Mckay [phone] at bedside with whom pt has lived for 2 yrs. Erin Mckay reports pt has no MH inpatient history or outpatient history. She says pt seemed "depressed" upon retirement but didn't take any psych meds. She reports pt was dx with dementia by pt's PCP. Erin Mckay says pt sleeps fairly well, but as soon as she wakes up she starts yelling about how much her "tail hurts".  Erin Mckay reports for the past few days, pt has accused all her children and everyone else of trying to kill her. She says pt thinks Erin Mckay is trying to poison her when she cooks. She says yesterday pt didn't recognize any of her kids. 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 She cannot complete her ADLs on her own except for walking. Erin Mckay says in past two years pt has had vulvar CA and pt's best friend has died. She sts for the past year, pt has told Erin Mckay that at night, someone comes in patient's room "to tell her how things really are." She sts no one lives in home other than pt and herself. She sts she has been trying to get pt into nursing facility.  More collateral info provided by daughter Erin Mckay [phone] who is also bedside. She reports MDs started pt on low dose of pain meds for hemhorrhoid pain. She sts thinks the pain pills are contributing  to pt's paranoia. Erin Mckay says she thinks pt doesn't need such a high level of pain meds. She sts she thinks pt's physical pain is in her mind. Daughter says pt never reports reduction of pain after ingesting a pain pill. Erin reports pt has no hx of suicide attempts. She doesn't have an advanced directive and pt is her own guardian.   Erin Mckay is an 82 y.o. female.   Diagnosis:  Major Neurocognitive Disorder  Past Medical History:  Past Medical History:  Diagnosis Date  . Cataract    03-20-14 right eye" afraid to have surgery"  . 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 Frequent UTI   . Hard of hearing   . Hemorrhoids    remains a problem.  . Hypertension   . Measles   . Vision loss, bilateral   . Vulvar cancer ([place]) 02/09/14  . Vulvar cancer ([place]) 03/28/14    Past Surgical History:  Procedure Laterality Date  . APPENDECTOMY    . CATARACT EXTRACTION Left    "blinded" s/p cataract surgery, cataract on left -"afraid to have surgery"  . EYE SURGERY Left    cataract removal and was blinded.  Marland Kitchen FLEXIBLE SIGMOIDOSCOPY N/A 08/08/2014   Dr. Oneida Alar: moderate sized hemorrhoids Grade 2-3 with banding X 3. Mild diverticulosis in sigmoid colon  . FLEXIBLE SIGMOIDOSCOPY N/A 08/22/2014   3  small ulcers present where prior bands placed   . HEMORRHOID BANDING N/A 08/08/2014   Procedure: HEMORRHOID BANDING;  Surgeon: Danie Binder, MD;  [facility];  Service: Endoscopy;  Laterality: N/A;  . TUBAL LIGATION    . VULVECTOMY Right 03/28/2014   Procedure: WIDE LOCAL EXCISION OF RIGHT VULVAR;  Surgeon: Everitt Amber, MD;  Location: [facility];  Service: Gynecology;  Laterality: Right;    Family History:  Family History  Problem Relation Age of Onset  . Heart disease Mother   . Early death Father   . Anuerysm Sister     stomach  . Cancer Brother   . Hypertension Daughter   . Hyperlipidemia Daughter   . Cancer Son     bladder  . Heart  disease Brother   . Hypertension Son   . Hypertension Son   . Hypertension Daughter   . Colon cancer Neg Hx     Social History:  reports that she has been smoking Cigarettes.  She has a 48.00 pack-year smoking history. She has quit using smokeless tobacco. She reports that she does not drink alcohol or use drugs.  Additional Social History:  Alcohol / Drug Use Pain Medications: unable to assess Prescriptions: unable to assess Over the Counter: unable to assess  CIWA: CIWA-Ar BP: 144/71 Pulse Rate: 87 COWS:    PATIENT STRENGTHS: (choose at least two) Communication skills Supportive family/friends  Allergies:  Allergies  Allergen Reactions  . Pineapple Swelling  . Streptomycin     Any mycin drugs, causes numbness all over body    Home Medications:  (Not in a hospital admission)  OB/GYN Status:  No LMP recorded. Patient is postmenopausal.  General Assessment Data Location of Assessment: [facility] TTS Assessment: In system Is this a Tele or Face-to-Face Assessment?: Tele Assessment Is this an Initial Assessment or a Re-assessment for this encounter?: Initial Assessment Marital status: Single Maiden name: Aleene Davidson Is patient pregnant?: No Pregnancy Status: No Living Arrangements: Children (Erin Mckay) Can pt return to current living arrangement?: No Admission Status: Voluntary Is patient capable of signing voluntary admission?: No Referral Source: Other Insurance type: medicare     Crisis Care Plan Living Arrangements: Children (Erin Mckay) Name of Psychiatrist: none Name of Therapist: none  Education Status Is patient currently in school?: No Current Grade:   Highest grade of school patient has completed: 9  Risk to self with the past 6 months Suicidal Ideation: No Has patient been a risk to self within the past 6 months prior to admission? : No Suicidal Intent:  (unable to assess) Has patient had any suicidal intent within the past 6 months prior to  admission? : No Is patient at risk for suicide?: No Suicidal Plan?:  (unable to assess) Has patient had any suicidal plan within the past 6 months prior to admission? : No Access to Means:  (n/a) What has been your use of drugs/alcohol within the last 12 months?: none Previous Attempts/Gestures: No How many times?: 0 Other Self Harm Risks: none Triggers for Past Attempts:  (n/a) Intentional Self Injurious Behavior: None Family Suicide History: Unable to assess Recent stressful life event(s):  (unable to assess) Persecutory voices/beliefs?: Yes Depression:  (unable to assess) Depression Symptoms: Feeling angry/irritable Substance abuse history and/or treatment for substance abuse?: No Suicide prevention information given to non-admitted patients: Not applicable  Risk to Others within the past 6 months Homicidal Ideation:  (unable to assess) Does patient have any lifetime risk of violence toward others  beyond the six months prior to admission? : No Thoughts of Harm to Others:  (unable to assess) Current Homicidal Intent:  (unable to assess) Current Homicidal Plan:  (unable to assess) Access to Homicidal Means: No Identified Victim: unable to assess History of harm to others?: No Assessment of Violence: None Noted Violent Behavior Description: per daughter, pt has no hx of violence Does patient have access to weapons?: No Criminal Charges Pending?: No Does patient have a court date: No Is patient on probation?: No  Psychosis Hallucinations: Auditory, Visual (per daughter, pt thinks "someone" comes in her room at night) Delusions: Persecutory (thinks everyone is trying to kill her)  Mental Status Report Appearance/Hygiene: Unremarkable, In scrubs Eye Contact: Poor Motor Activity: Freedom of movement, Restlessness Speech: Logical/coherent, Argumentative, Loud Level of Consciousness: Irritable, Alert, Restless Mood:  (unable to assess) Affect: Irritable, Anxious Anxiety  Level: Moderate Thought Processes: Tangential, Circumstantial, Irrelevant Judgement: Impaired Orientation: Not oriented Obsessive Compulsive Thoughts/Behaviors: Unable to Assess  Cognitive Functioning Concentration: Decreased Memory: Remote Impaired, Recent Impaired IQ: Average Insight: Poor Impulse Control: Poor Appetite: Fair Sleep: No Change Total Hours of Sleep: 8 Vegetative Symptoms: None  ADLScreening [facility] Assessment Services) Patient's cognitive ability adequate to safely complete daily activities?: No Patient able to express need for assistance with ADLs?: Yes Independently performs ADLs?: No  Prior Inpatient Therapy Prior Inpatient Therapy: No  Prior Outpatient Therapy Prior Outpatient Therapy: No Does patient have an ACCT team?: No Does patient have Intensive In-House Services?  : No Does patient have [facility] services? : Unknown Does patient have P4CC services?: Unknown  ADL Screening (condition at time of admission) Patient's cognitive ability adequate to safely complete daily activities?: No Is the patient deaf or have difficulty hearing?: Yes Does the patient have difficulty seeing, even when wearing glasses/contacts?: Yes (pt totally blind in one eye and has macular degeneration in other eye) Does the patient have difficulty concentrating, remembering, or making decisions?: Yes Patient able to express need for assistance with ADLs?: Yes Does the patient have difficulty dressing or bathing?: Yes Independently performs ADLs?: No Communication: Independent Dressing (OT): Needs assistance Is this a change from baseline?: Pre-admission baseline Grooming: Needs assistance Is this a change from baseline?: Pre-admission baseline Feeding: Needs assistance Is this a change from baseline?: Pre-admission baseline Bathing: Needs assistance Is this a change from baseline?: Pre-admission baseline Toileting: Needs assistance Is this a change from baseline?:  Pre-admission baseline In/Out Bed: Needs assistance Is this a change from baseline?: Pre-admission baseline Walks in Home: Independent Does the patient have difficulty walking or climbing stairs?: Yes Weakness of Legs:  (unable to assess) Weakness of Arms/Hands:  (unable to assess)  Home Assistive Devices/Equipment Home Assistive Devices/Equipment: Raised toilet seat with rails, Built-in shower seat, Eyeglasses    Abuse/Neglect Assessment (Assessment to be complete while patient is alone) Physical Abuse:  (unable to assess) Verbal Abuse:  (unable to assess) Sexual Abuse:  (unable to assess) Exploitation of patient/patient's resources:  (unable to assess) Self-Neglect:  (unable to assess)     Advance Directives (For Healthcare) Does patient have an advance directive?: No Would patient like information on creating an advanced directive?: No - patient declined information    Additional Information 1:1 In Past 12 Months?: No CIRT Risk: Yes Elopement Risk: Yes Does patient have medical clearance?: No     Disposition:  Disposition Initial Assessment Completed for this Encounter: Yes Disposition of Patient: Inpatient treatment program Ludger Nutting lewis FNP rec geropsych placement)  Writer notified EDP Vanita Panda of disposition  recommendation.  Hatsumi Steinhart P 05/18/2016 4:00 PM

## 2016-05-18 NOTE — ED Provider Notes (Signed)
[facility] DEPT Provider Note   CSN: [id number] Arrival date & time: 05/18/16  1355     History   Chief Complaint No chief complaint on file.   HPI Erin Mckay is a 82 y.o. female.  The patient is a 82 year old female, she has a history of hypertension, frequent urinary tract infections and dementia, she also has a history of vulvar cancer and hemorrhoids. She presents to the hospital by paramedic transport after family noticed that for the last 2 days she has been very agitated, talking about "people can get me", "people are going to murder me", and asking for bizarre things. She will not sleep, she is constantly talking, they state that she is very abnormal compared to baseline. Family members are not present on arrival to give this information, it was obtained from paramedics. The patient is unable to give me any valuable information that she just continues to say over and over "please let me go, don't let them get me"      Past Medical History:  Diagnosis Date  . Cataract    03-20-14 right eye" afraid to have surgery"  . 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 Frequent UTI   . Hard of hearing   . Hemorrhoids    remains a problem.  . Hypertension   . Measles   . Vision loss, bilateral   . Vulvar cancer ([place]) 02/09/14  . Vulvar cancer ([place]) 03/28/14    Patient Active Problem List   Diagnosis Date Noted  . Rectal pain   . Internal hemorrhoid   . Internal prolapsed hemorrhoids 05/23/2014  . Vulvar candidiasis 04/28/2014  . Hypertension 02/27/2014  . 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 Vulvar cancer ([place]) 02/09/2014    Past Surgical History:  Procedure Laterality Date  . APPENDECTOMY    . CATARACT EXTRACTION Left    "blinded" s/p cataract surgery, cataract on left -"afraid to have surgery"  . EYE SURGERY Left    cataract removal and was blinded.  Marland Kitchen FLEXIBLE SIGMOIDOSCOPY N/A 08/08/2014   Dr. Oneida Alar: moderate sized  hemorrhoids Grade 2-3 with banding X 3. Mild diverticulosis in sigmoid colon  . FLEXIBLE SIGMOIDOSCOPY N/A 08/22/2014   3 small ulcers present where prior bands placed   . HEMORRHOID BANDING N/A 08/08/2014   Procedure: HEMORRHOID BANDING;  Surgeon: Danie Binder, MD;  [facility];  Service: Endoscopy;  Laterality: N/A;  . TUBAL LIGATION    . VULVECTOMY Right 03/28/2014   Procedure: WIDE LOCAL EXCISION OF RIGHT VULVAR;  Surgeon: Everitt Amber, MD;  Location: [facility];  Service: Gynecology;  Laterality: Right;    OB History    Gravida Para Term Preterm AB Living   5 5 5     4    SAB TAB Ectopic Multiple Live Births                   Home Medications    Prior to Admission medications   Medication Sig Start Date End Date Taking? Authorizing Provider  Ca Carbonate-Mag Hydroxide (ROLAIDS PO) Take 3 each by mouth as needed.    Historical Provider, MD  docusate sodium (COLACE) 100 MG capsule Take 100 mg by mouth daily as needed.     Historical Provider, MD  HYDROcodone-acetaminophen (NORCO) 10-325 MG tablet Take 1 tablet by mouth every 4 (four) hours as needed.    Historical Provider, MD  hydrocortisone (PROCTOZONE-HC) 2.5 % rectal cream Place 1  application rectally 2 (two) times daily. 04/16/16   Annitta Needs, NP  lidocaine (XYLOCAINE) 5 % ointment Apply 1 application topically as needed. Patient not taking: Reported on 04/16/2016 03/04/16   Jonnie Kind, MD  linaclotide [facility]) 145 MCG CAPS capsule Take 1 capsule (145 mcg total) by mouth daily before breakfast. 05/16/16   Annitta Needs, NP  lisinopril (PRINIVIL,ZESTRIL) 40 MG tablet Take 40 mg by mouth at bedtime.  11/14/13   Historical Provider, MD  mometasone (ELOCON) 0.1 % cream APPLY RECTALLY TWICE DAILY FOR 14 DAYS 01/01/16   Historical Provider, MD  polyethylene glycol (MIRALAX / GLYCOLAX) packet Take 17 g by mouth daily.     Historical Provider, MD  silver sulfADIAZINE (SILVADENE) 1 % cream Apply 1 application topically daily. As  directed    Historical Provider, MD  simethicone (MYLICON) 0000000 MG chewable tablet Chew 125 mg by mouth every 6 (six) hours as needed for flatulence.    Historical Provider, MD    Family History Family History  Problem Relation Age of Onset  . Heart disease Mother   . Early death Father   . Anuerysm Sister     stomach  . Cancer Brother   . Hypertension Daughter   . Hyperlipidemia Daughter   . Cancer Son     bladder  . Heart disease Brother   . Hypertension Son   . Hypertension Son   . Hypertension Daughter   . Colon cancer Neg Hx     Social History Social History  Substance Use Topics  . Smoking status: Current Every Day Smoker    Packs/day: 1.00    Years: 48.00    Types: Cigarettes  . Smokeless tobacco: Former Systems developer     Comment: one pack daily  . Alcohol use No     Allergies   Pineapple and Streptomycin   Review of Systems Review of Systems  All other systems reviewed and are negative.    Physical Exam Updated Vital Signs There were no vitals taken for this visit.  Physical Exam  Constitutional: She appears well-developed and well-nourished. She appears distressed ( agitated).  HENT:  Head: Normocephalic and atraumatic.  Mouth/Throat: Oropharynx is clear and moist. No oropharyngeal exudate.  edentulous  Eyes: Conjunctivae and EOM are normal. Pupils are equal, round, and reactive to light. Right eye exhibits no discharge. Left eye exhibits no discharge. No scleral icterus.  Neck: Normal range of motion. Neck supple. No JVD present. No thyromegaly present.  Cardiovascular: Normal rate, regular rhythm, normal heart sounds and intact distal pulses.  Exam reveals no gallop and no friction rub.   No murmur heard. Pulmonary/Chest: Effort normal and breath sounds normal. No respiratory distress. She has no wheezes. She has no rales.  Abdominal: Soft. Bowel sounds are normal. She exhibits no distension and no mass. There is no tenderness.  Musculoskeletal: Normal  range of motion. She exhibits no edema or tenderness.  Lymphadenopathy:    She has no cervical adenopathy.  Neurological: She is alert. Coordination normal.  The pt is talking clearly, she moves all 4 extremities as she is actively trying to escape from the bed - crawling out of bed and pushing away at staff.  Skin: Skin is warm and dry. No rash noted. No erythema.  Psychiatric:  Agitated, paranoid  Nursing note and vitals reviewed.    ED Treatments / Results  Labs (all labs ordered are listed, but only abnormal results are displayed) Labs Reviewed - No data to display  EKG  EKG Interpretation  Date/Time:  Sunday May 18 2016 14:19:13 EDT Ventricular Rate:  94 PR Interval:    QRS Duration: 81 QT Interval:  332 QTC Calculation: 416 R Axis:   79 Text Interpretation:  Sinus rhythm Probable LVH with secondary repol abnrm Baseline wander in lead(s) II aVR aVF Since last tracing Left ventricular hypertrophy NOW PRESENT Confirmed by Sabra Heck  MD, Glinda Natzke ([id number]) on 05/18/2016 2:39:04 PM       Radiology No results found.  Procedures Procedures (including critical care time)  Medications Ordered in ED Medications  ziprasidone (GEODON) injection 10 mg (not administered)     Initial Impression / Assessment and Plan / ED Course  I have reviewed the triage vital signs and the nursing notes.  Pertinent labs & imaging results that were available during my care of the patient were reviewed by me and considered in my medical decision making (see chart for details).  Clinical Course   The patient's family members have arrived and have explained that the patient has had 2 years of worsening mental status, this has culminated in several days of increased paranoia that someone is trying to kill her, increased agitation, she has become violent and pushing and hitting those around her, they have been trying to get her into her nursing facility because they know that they cannot handle her  anymore however they state that every time she goes to a medical provider and they're told that she needs to be admitted for 3 days to a hospital before Medicare or Medicaid would help to pay for a skilled nursing facility admission. The family believes that the patient has underlying psychiatric issues which is causing her to have increased paranoia and agitation with violent behavior.    Psychiatric consultation has been requested, full medical evaluation has been ordered, the family is in agreement with the plan   D/w Dr. Vanita Panda at change of shift - he will follow up TTS and lab results / recommendation - to dispo accordingly  Final Clinical Impressions(s) / ED Diagnoses   Final diagnoses:  None    New Prescriptions New Prescriptions   No medications on file     Noemi Chapel, MD 05/18/16 1532

## 2016-05-18 NOTE — ED Notes (Signed)
Daughters contact.   Enid Skeens [phone]                                   Olam Idler [phone]

## 2016-05-18 NOTE — ED Triage Notes (Signed)
Per EMS patient from home. Patients daughter called EMS stating patient has dementia that has progressed to patient not knowing who her children are.  Patient is combative. Patient knows daughter and is angry that she is in ED.

## 2016-05-19 DIAGNOSIS — F23 Brief psychotic disorder: Secondary | ICD-10-CM | POA: Diagnosis not present

## 2016-05-19 LAB — BLOOD GAS, ARTERIAL
ACID-BASE EXCESS: 1.6 mmol/L (ref 0.0–2.0)
Bicarbonate: 26 mmol/L (ref 20.0–28.0)
DRAWN BY: 270161
FIO2: 21
O2 Saturation: 95.5 %
PH ART: 7.44 (ref 7.350–7.450)
Patient temperature: 37
pCO2 arterial: 37.3 mmHg (ref 32.0–48.0)
pO2, Arterial: 78.4 mmHg — ABNORMAL LOW (ref 83.0–108.0)

## 2016-05-19 NOTE — Progress Notes (Addendum)
Spoke with Jaclyn Shaggy at [facility], notified her EDP Dr. Sabra Heck requests call to discuss further medical clearance exams requested. Jaclyn Shaggy states she will notify Dr. Truman Hayward to call [phone]. Called back to state, per Dr. Truman Hayward, "please get the echocardiogram and ABG and we will go from there." Advised [place], MSW, [place] Work, Disposition  05/19/2016 [phone]

## 2016-05-19 NOTE — Progress Notes (Signed)
Reviewed pt's chart. Geriatric psychiatric inpatient treatment recommended by TTS 05/18/16.   Pt is on waiting list at [facility] per Rod Holler Under review for admission to [facility] per [facility].   Declined at [facility] due to dementia (dementia/Alzheimer's is exclusionary for their geriatric program).   Will continue to follow up on referrals.  Sharren Bridge, MSW, LCSW Clinical Social Work, Disposition  05/19/2016 [phone]

## 2016-05-19 NOTE — Progress Notes (Signed)
RN Wells Guiles informed Probation officer that patient's EKG and ABG clinicals have been done and Dr. Sabra Heck at [facility] spoke with Dr. Truman Hayward at [facility] and agreed that these clinicals would be enough for pt to be considered for admission.  CSW faxed patient's EKG and ABG to [facility]. Will continue to follow up with placement efforts.  Verlon Setting, [place] Disposition staff 05/19/2016 6:07 PM

## 2016-05-19 NOTE — ED Notes (Signed)
Dr. Sabra Heck called states to go ahead and order a echo for in the morning

## 2016-05-19 NOTE — ED Notes (Signed)
Family (2 sons) in to visit patient at this time.

## 2016-05-19 NOTE — Progress Notes (Addendum)
Colleen at [facility] states admitting psychiatrist has approved pt for admission, however, medical attending must also approve pt. States she is awaiting MD review and will call back with outcome.  Sharren Bridge, MSW, LCSW Clinical Social Work, Disposition  05/19/2016 [phone]  Jaclyn Shaggy called backBoykin Nearing MD requesting echocardiogram, ABG, and a cardiac consult.  Informed [facility] of request.

## 2016-05-19 NOTE — ED Notes (Addendum)
Meagan from [facility] called and said that Erin Mckay is requesting that we order a repeat EKG, ABG and cardiac consult for further medical clearance. EDP made aware and requests made for [facility] to call Dr. Sabra Heck for requests for further exam. Meagan at [facility] made aware that Dr. Sabra Heck wants to talk with [facility] staff.

## 2016-05-19 NOTE — ED Notes (Signed)
Spoke with rep from [facility], they requested an echocardiogram be done on patient, I informed Dr. Sabra Heck who requested to speak with them. I called them back however was told that the physician who ordered the test was not there, no one was on call and no one would be available to speak with Dr. Sabra Heck until in the morning, the person who called be regarding the order was a third party who could not speak with the physician

## 2016-05-19 NOTE — Progress Notes (Signed)
Writer followed up on placement efforts at [facility].  Spoke with intake at [facility] and was informed that the EKG and ABG have been received. Also, Erin Mckay is requesting an Endocardiogram on patient.  Spoke with [facility] RN Aldona Bar about request for Endocardiogram and was advised by RN that Erin Mckay to call RN for clarification of the requested Endocardiogram.  Writer spoke with [facility] intake and inquired if they could call RN Samantha at [phone].  Intake informed that they will call RN shortly.  CSW will continue to follow up.  Verlon Setting, [place] Disposition staff 05/19/2016 7:21 PM

## 2016-05-20 ENCOUNTER — Emergency Department (HOSPITAL_COMMUNITY): Payer: Medicare Other

## 2016-05-20 DIAGNOSIS — H538 Other visual disturbances: Secondary | ICD-10-CM | POA: Diagnosis not present

## 2016-05-20 DIAGNOSIS — K210 Gastro-esophageal reflux disease with esophagitis: Secondary | ICD-10-CM | POA: Diagnosis not present

## 2016-05-20 DIAGNOSIS — R52 Pain, unspecified: Secondary | ICD-10-CM | POA: Diagnosis not present

## 2016-05-20 DIAGNOSIS — K5909 Other constipation: Secondary | ICD-10-CM | POA: Diagnosis not present

## 2016-05-20 DIAGNOSIS — F0391 Unspecified dementia with behavioral disturbance: Secondary | ICD-10-CM | POA: Diagnosis not present

## 2016-05-20 DIAGNOSIS — Z87891 Personal history of nicotine dependence: Secondary | ICD-10-CM | POA: Diagnosis not present

## 2016-05-20 DIAGNOSIS — F17210 Nicotine dependence, cigarettes, uncomplicated: Secondary | ICD-10-CM | POA: Diagnosis not present

## 2016-05-20 DIAGNOSIS — J449 Chronic obstructive pulmonary disease, unspecified: Secondary | ICD-10-CM | POA: Diagnosis not present

## 2016-05-20 DIAGNOSIS — Z8544 Personal history of malignant neoplasm of other female genital organs: Secondary | ICD-10-CM | POA: Diagnosis not present

## 2016-05-20 DIAGNOSIS — R5383 Other fatigue: Secondary | ICD-10-CM | POA: Diagnosis not present

## 2016-05-20 DIAGNOSIS — Z66 Do not resuscitate: Secondary | ICD-10-CM | POA: Diagnosis not present

## 2016-05-20 DIAGNOSIS — F0390 Unspecified dementia without behavioral disturbance: Secondary | ICD-10-CM | POA: Diagnosis not present

## 2016-05-20 DIAGNOSIS — Z79899 Other long term (current) drug therapy: Secondary | ICD-10-CM | POA: Diagnosis not present

## 2016-05-20 DIAGNOSIS — K649 Unspecified hemorrhoids: Secondary | ICD-10-CM | POA: Diagnosis not present

## 2016-05-20 DIAGNOSIS — I517 Cardiomegaly: Secondary | ICD-10-CM | POA: Diagnosis not present

## 2016-05-20 DIAGNOSIS — G4709 Other insomnia: Secondary | ICD-10-CM | POA: Diagnosis not present

## 2016-05-20 DIAGNOSIS — R0600 Dyspnea, unspecified: Secondary | ICD-10-CM | POA: Diagnosis not present

## 2016-05-20 DIAGNOSIS — N390 Urinary tract infection, site not specified: Secondary | ICD-10-CM | POA: Diagnosis not present

## 2016-05-20 DIAGNOSIS — I10 Essential (primary) hypertension: Secondary | ICD-10-CM | POA: Diagnosis not present

## 2016-05-20 DIAGNOSIS — M6250 Muscle wasting and atrophy, not elsewhere classified, unspecified site: Secondary | ICD-10-CM | POA: Diagnosis not present

## 2016-05-20 DIAGNOSIS — F0281 Dementia in other diseases classified elsewhere with behavioral disturbance: Secondary | ICD-10-CM | POA: Diagnosis present

## 2016-05-20 DIAGNOSIS — E860 Dehydration: Secondary | ICD-10-CM | POA: Diagnosis not present

## 2016-05-20 DIAGNOSIS — E876 Hypokalemia: Secondary | ICD-10-CM | POA: Diagnosis not present

## 2016-05-20 DIAGNOSIS — R110 Nausea: Secondary | ICD-10-CM | POA: Diagnosis not present

## 2016-05-20 DIAGNOSIS — I714 Abdominal aortic aneurysm, without rupture: Secondary | ICD-10-CM | POA: Diagnosis not present

## 2016-05-20 DIAGNOSIS — R1311 Dysphagia, oral phase: Secondary | ICD-10-CM | POA: Diagnosis not present

## 2016-05-20 DIAGNOSIS — N179 Acute kidney failure, unspecified: Secondary | ICD-10-CM | POA: Diagnosis not present

## 2016-05-20 DIAGNOSIS — F068 Other specified mental disorders due to known physiological condition: Secondary | ICD-10-CM | POA: Diagnosis not present

## 2016-05-20 DIAGNOSIS — G309 Alzheimer's disease, unspecified: Secondary | ICD-10-CM | POA: Diagnosis present

## 2016-05-20 DIAGNOSIS — F322 Major depressive disorder, single episode, severe without psychotic features: Secondary | ICD-10-CM | POA: Diagnosis present

## 2016-05-20 DIAGNOSIS — Z923 Personal history of irradiation: Secondary | ICD-10-CM | POA: Diagnosis not present

## 2016-05-20 DIAGNOSIS — R2990 Unspecified symptoms and signs involving the nervous system: Secondary | ICD-10-CM | POA: Diagnosis not present

## 2016-05-20 DIAGNOSIS — R1111 Vomiting without nausea: Secondary | ICD-10-CM | POA: Diagnosis not present

## 2016-05-20 DIAGNOSIS — F23 Brief psychotic disorder: Secondary | ICD-10-CM | POA: Diagnosis not present

## 2016-05-20 DIAGNOSIS — D72829 Elevated white blood cell count, unspecified: Secondary | ICD-10-CM | POA: Diagnosis not present

## 2016-05-20 DIAGNOSIS — R630 Anorexia: Secondary | ICD-10-CM | POA: Diagnosis not present

## 2016-05-20 DIAGNOSIS — R410 Disorientation, unspecified: Secondary | ICD-10-CM | POA: Diagnosis present

## 2016-05-20 DIAGNOSIS — F329 Major depressive disorder, single episode, unspecified: Secondary | ICD-10-CM | POA: Diagnosis not present

## 2016-05-20 DIAGNOSIS — K2900 Acute gastritis without bleeding: Secondary | ICD-10-CM | POA: Diagnosis not present

## 2016-05-20 DIAGNOSIS — R627 Adult failure to thrive: Secondary | ICD-10-CM | POA: Diagnosis not present

## 2016-05-20 NOTE — ED Notes (Signed)
Pt not eating well, playing with food, complaining it is not good, pt complaining of pain in her vagina. Pt wanting family, pt states " just kill me" then she will say " please help me"  " no one cares about me"

## 2016-05-20 NOTE — ED Notes (Signed)
Feeding pt cereal. Pt ate 1/4 then started spitting out,complained every bite,

## 2016-05-20 NOTE — ED Notes (Signed)
Pt accepted at [facility], MD Aware, need to IVC pt and fax before transport.

## 2016-05-20 NOTE — ED Notes (Signed)
IVC Papers completed by EDP, Faxed to [employer], called clerk and now waiting for Findings and Custody to be served.

## 2016-05-20 NOTE — Progress Notes (Signed)
Faxed copies of IVC to [facility]. Per Rod Holler, pt can be transferred anytime.  Sharren Bridge, MSW, LCSW Clinical Social Work, Disposition  05/20/2016 [phone]

## 2016-05-20 NOTE — ED Notes (Signed)
Faxed IVC papers, after being served to Pt, to [place] at [facility].. Per Megan, "Pt is good to go".  Nurse informed and we called [facility] for transport.

## 2016-05-20 NOTE — Progress Notes (Signed)
Received call from Rod Holler at [facility]- Dr. Jamse Arn has accepted pt for admission to geriatric psychiatry unit at [facility]. Requests pt be under IVC due to reported symptoms of paranoia and assessments suggesting pt would not have capacity to consent for inpatient treatment. Spoke with EDP and RN. IVC to be initiated.   Called back and spoke with Rod Holler. She states pt can be transported to [facility] upon completion of IVC paperwork, and RN report # is [phone] ex: [id number].  Sharren Bridge, MSW, LCSW Clinical Social Work, Disposition  05/20/2016 [phone]

## 2016-05-20 NOTE — ED Notes (Signed)
Called to confirm echo order has been seen. Left a message.

## 2016-05-28 ENCOUNTER — Ambulatory Visit (HOSPITAL_COMMUNITY): Payer: Medicare Other | Admitting: Hematology

## 2016-05-29 DIAGNOSIS — E876 Hypokalemia: Secondary | ICD-10-CM | POA: Diagnosis not present

## 2016-05-29 DIAGNOSIS — E860 Dehydration: Secondary | ICD-10-CM | POA: Diagnosis not present

## 2016-05-29 DIAGNOSIS — F329 Major depressive disorder, single episode, unspecified: Secondary | ICD-10-CM | POA: Diagnosis not present

## 2016-05-29 DIAGNOSIS — I10 Essential (primary) hypertension: Secondary | ICD-10-CM | POA: Diagnosis not present

## 2016-05-29 DIAGNOSIS — I517 Cardiomegaly: Secondary | ICD-10-CM | POA: Diagnosis not present

## 2016-05-29 DIAGNOSIS — F0391 Unspecified dementia with behavioral disturbance: Secondary | ICD-10-CM | POA: Diagnosis not present

## 2016-05-29 DIAGNOSIS — N179 Acute kidney failure, unspecified: Secondary | ICD-10-CM | POA: Diagnosis not present

## 2016-05-30 DIAGNOSIS — I10 Essential (primary) hypertension: Secondary | ICD-10-CM | POA: Diagnosis not present

## 2016-05-30 DIAGNOSIS — E876 Hypokalemia: Secondary | ICD-10-CM | POA: Diagnosis not present

## 2016-05-30 DIAGNOSIS — F0391 Unspecified dementia with behavioral disturbance: Secondary | ICD-10-CM | POA: Diagnosis not present

## 2016-05-30 DIAGNOSIS — E860 Dehydration: Secondary | ICD-10-CM | POA: Diagnosis not present

## 2016-05-30 DIAGNOSIS — N179 Acute kidney failure, unspecified: Secondary | ICD-10-CM | POA: Diagnosis not present

## 2016-06-05 DIAGNOSIS — E860 Dehydration: Secondary | ICD-10-CM | POA: Diagnosis not present

## 2016-06-05 DIAGNOSIS — Z87891 Personal history of nicotine dependence: Secondary | ICD-10-CM | POA: Diagnosis not present

## 2016-06-05 DIAGNOSIS — I10 Essential (primary) hypertension: Secondary | ICD-10-CM | POA: Diagnosis not present

## 2016-06-24 DIAGNOSIS — R419 Unspecified symptoms and signs involving cognitive functions and awareness: Secondary | ICD-10-CM | POA: Diagnosis not present

## 2016-07-08 ENCOUNTER — Ambulatory Visit: Payer: Medicare Other | Admitting: Family Medicine

## 2016-07-15 ENCOUNTER — Ambulatory Visit (INDEPENDENT_AMBULATORY_CARE_PROVIDER_SITE_OTHER): Payer: Medicare Other | Admitting: Family Medicine

## 2016-07-15 ENCOUNTER — Encounter: Payer: Self-pay | Admitting: Family Medicine

## 2016-07-15 VITALS — BP 115/69 | HR 91 | Temp 98.6°F | Ht 61.0 in | Wt 121.0 lb

## 2016-07-15 DIAGNOSIS — R946 Abnormal results of thyroid function studies: Secondary | ICD-10-CM

## 2016-07-15 DIAGNOSIS — K648 Other hemorrhoids: Secondary | ICD-10-CM | POA: Diagnosis not present

## 2016-07-15 DIAGNOSIS — F0390 Unspecified dementia without behavioral disturbance: Secondary | ICD-10-CM | POA: Diagnosis not present

## 2016-07-15 DIAGNOSIS — K6289 Other specified diseases of anus and rectum: Secondary | ICD-10-CM | POA: Diagnosis not present

## 2016-07-15 DIAGNOSIS — C519 Malignant neoplasm of vulva, unspecified: Secondary | ICD-10-CM | POA: Diagnosis not present

## 2016-07-15 DIAGNOSIS — I10 Essential (primary) hypertension: Secondary | ICD-10-CM | POA: Diagnosis not present

## 2016-07-15 MED ORDER — VENLAFAXINE HCL 25 MG PO TABS: ORAL_TABLET | ORAL | 0 refills | Status: DC

## 2016-07-15 NOTE — Progress Notes (Signed)
Subjective:  Patient ID: Erin Mckay, female    DOB: 06/13/1924  Age: 82 y.o. MRN: [id number]  CC: Establish Care (former patient of Dr. Melina Copa, changing doctor because wants all records within [facility].  ); Discuss medications (Taking Venlafaxine 75 mg and daughter is trying to wean her off; ); Hemorrhoids (using Hydrocortisone rectal cream); and Hypertension (Had been on Amlodine 10 mg and Lisinopril 20 mg, caused hypotension so daughter is only giving her Amlodine 10 mg)   HPI JALAYA LARGENT presents for Follow-up from recent hospitalization. Her daughter gives the history. She is primary caretaker. Her name is Erin Mckay. She tells me that her mother has been in essentially good health until 2 years ago she was diagnosed with vulvar cancer. She had hemorrhoids prior to that time which were manageable. However after vulvectomy and radiation her hemorrhoids became intractable. She just screams with pain. 2 years ago she had some hemorrhoids banded with Dr. Oneida Alar in [place]. She was also started on Tylenol No. 2. When that became ineffective she was switched to hydrocodone. Increasing doses of this led to severe constipation and worsening of the hemorrhoids. The medication also led to her having what her daughter describes as a nervous breakdown. She was recently hospitalized in [place] for this in a mental facility. She was placed on multiple medications for her mental status. These were reviewed from care everywhere. However, today the daughter says that she has through mental health weaned her down to the medications listed today. They include amlodipine for blood pressure 10 mg daily, ProctoCream, Elocon and venlafaxine 75 mg daily. The daughter requested that she be weaned off of the venlafaxine because she does not have a long-term mental illness. She feels that taking her off of the opiates is all she needed for her mental status. However, there is an element of dementia. Her daughter  states that she talks clearly on some days other days she is very quiet sometimes she just talks gibberish.  History Erin Mckay has a past medical history of Cataract; Complication of anesthesia; Frequent UTI; Hard of hearing; Hemorrhoids; Hypertension; Measles; Vision loss, bilateral; Vulvar cancer ([place]) (02/09/14); and Vulvar cancer ([place]) (03/28/14).   She has a past surgical history that includes Tubal ligation; Appendectomy; Eye surgery (Left); Cataract extraction (Left); Vulvectomy (Right, 03/28/2014); Flexible sigmoidoscopy (N/A, 08/08/2014); Hemorrhoid banding (N/A, 08/08/2014); and Flexible sigmoidoscopy (N/A, 08/22/2014).   Her family history includes Anuerysm in her sister; Cancer in her brother and son; Early death in her father; Heart disease in her brother and mother; Hyperlipidemia in her daughter; Hypertension in her daughter, daughter, son, and son.She reports that she has been smoking Cigarettes.  She has a 48.00 pack-year smoking history. She has quit using smokeless tobacco. She reports that she does not drink alcohol or use drugs.    ROS Review of Systems  Unable to perform ROS: Dementia    Objective:  BP 115/69   Pulse 91   Temp 98.6 F (37 C) (Oral)   Ht 5\' 1"  (1.549 m)   Wt 121 lb (54.9 kg)   BMI 22.86 kg/m   BP Readings from Last 3 Encounters:  07/15/16 115/69  05/20/16 188/79  04/16/16 (!) 192/85    Wt Readings from Last 3 Encounters:  07/15/16 121 lb (54.9 kg)  05/18/16 120 lb (54.4 kg)  04/16/16 127 lb 12.8 oz (58 kg)     Physical Exam  Constitutional: She is oriented to person, place, and time. She appears well-developed and well-nourished.  No distress.  HENT:  Head: Normocephalic and atraumatic.  Right Ear: External ear normal.  Left Ear: External ear normal.  Nose: Nose normal.  Mouth/Throat: Oropharynx is clear and moist.  Eyes: Conjunctivae and EOM are normal. Pupils are equal, round, and reactive to light.  Neck: Normal range of motion. Neck  supple. No thyromegaly present.  Cardiovascular: Normal rate, regular rhythm and normal heart sounds.   No murmur heard. Pulmonary/Chest: Effort normal and breath sounds normal. No respiratory distress. She has no wheezes. She has no rales.  Abdominal: Soft. Bowel sounds are normal. She exhibits no distension. There is no tenderness.  Lymphadenopathy:    She has no cervical adenopathy.  Neurological: She is alert and oriented to person, place, and time. She has normal reflexes.  Skin: Skin is warm and dry.  Psychiatric: She has a normal mood and affect. Her behavior is normal. Judgment and thought content normal.     Lab Results  Component Value Date   WBC 8.2 05/18/2016   HGB 12.8 05/18/2016   HCT 39.0 05/18/2016   PLT 229 05/18/2016   GLUCOSE 97 05/18/2016   ALT 17 05/18/2016   AST 23 05/18/2016   NA 138 05/18/2016   K 3.5 05/18/2016   CL 104 05/18/2016   CREATININE 0.66 05/18/2016   BUN 16 05/18/2016   CO2 26 05/18/2016   TSH 0.343 (L) 05/18/2016    Dg Chest Port 1 View  Result Date: 05/18/2016 CLINICAL DATA:  Pt brought to the ED by family due to worsening mental status. Pt. Has dementia and unable to cooperate. Best image obtainable. hx of hypertension, former smoker. EXAM: PORTABLE CHEST 1 VIEW COMPARISON:  03/20/2014 FINDINGS: Patient's chin over shadows the lung apices. Normal cardiac silhouette. Chronic bronchitic markings are present. Hiatal hernia. Mild basilar atelectasis. IMPRESSION: No acute cardiopulmonary process. Electronically Signed   By: Suzy Bouchard M.D.   On: 05/18/2016 17:09    Assessment & Plan:   Onetta was seen today for establish care, discuss medications, hemorrhoids and hypertension.  Diagnoses and all orders for this visit:  Rectal pain  Abnormal finding on thyroid function test -     TSH + free T4  Essential hypertension  Internal prolapsed hemorrhoids  Vulvar cancer ([place])  Dementia without behavioral disturbance, unspecified  dementia type  Other orders -     venlafaxine (EFFEXOR) 25 MG tablet; Two daily for 2 weeks, then 1 daily X 2wk. Then DC    I have discontinued Ms. Hazzard's lisinopril, polyethylene glycol, Ca Carbonate-Mag Hydroxide (ROLAIDS PO), HYDROcodone-acetaminophen, linaclotide, and venlafaxine. I am also having her start on venlafaxine. Additionally, I am having her maintain her hydrocortisone, amLODipine, mometasone, and mometasone.  Meds ordered this encounter  Medications  . amLODipine (NORVASC) 10 MG tablet    Sig: Take 10 mg by mouth daily.  . mometasone (ELOCON) 0.1 % cream    Sig: Apply 1 application topically daily.  Marland Kitchen DISCONTD: venlafaxine (EFFEXOR) 75 MG tablet    Sig: Take 75 mg by mouth daily.  . mometasone (ELOCON) 0.1 % cream    Sig: Apply topically.  . venlafaxine (EFFEXOR) 25 MG tablet    Sig: Two daily for 2 weeks, then 1 daily X 2wk. Then DC    Dispense:  42 tablet    Refill:  0     Follow-up: Return in about 6 weeks (around 08/26/2016).  Claretta Fraise, M.D.

## 2016-07-16 LAB — TSH+FREE T4
FREE T4: 0.76 ng/dL — AB (ref 0.82–1.77)
TSH: 0.668 u[IU]/mL (ref 0.450–4.500)

## 2016-07-17 LAB — THYROID PEROXIDASE ANTIBODY: THYROID PEROXIDASE ANTIBODY: 14 [IU]/mL (ref 0–34)

## 2016-07-17 LAB — SPECIMEN STATUS REPORT

## 2016-07-17 LAB — T3, FREE: T3 FREE: 2.9 pg/mL (ref 2.0–4.4)

## 2016-07-28 ENCOUNTER — Ambulatory Visit (INDEPENDENT_AMBULATORY_CARE_PROVIDER_SITE_OTHER): Payer: Medicare Other | Admitting: Family Medicine

## 2016-07-28 ENCOUNTER — Encounter: Payer: Self-pay | Admitting: Family Medicine

## 2016-07-28 VITALS — BP 148/87 | HR 85 | Temp 97.3°F | Ht 61.0 in | Wt 122.0 lb

## 2016-07-28 DIAGNOSIS — R300 Dysuria: Secondary | ICD-10-CM | POA: Diagnosis not present

## 2016-07-28 DIAGNOSIS — K640 First degree hemorrhoids: Secondary | ICD-10-CM | POA: Diagnosis not present

## 2016-07-28 DIAGNOSIS — K6289 Other specified diseases of anus and rectum: Secondary | ICD-10-CM | POA: Diagnosis not present

## 2016-07-28 DIAGNOSIS — K648 Other hemorrhoids: Secondary | ICD-10-CM | POA: Diagnosis not present

## 2016-07-28 DIAGNOSIS — I10 Essential (primary) hypertension: Secondary | ICD-10-CM | POA: Diagnosis not present

## 2016-07-28 LAB — MICROSCOPIC EXAMINATION
EPITHELIAL CELLS (NON RENAL): NONE SEEN /HPF (ref 0–10)
RBC, UA: NONE SEEN /hpf (ref 0–?)
RENAL EPITHEL UA: NONE SEEN /HPF

## 2016-07-28 LAB — URINALYSIS, COMPLETE
BILIRUBIN UA: NEGATIVE
Glucose, UA: NEGATIVE
Ketones, UA: NEGATIVE
LEUKOCYTES UA: NEGATIVE
Nitrite, UA: NEGATIVE
PH UA: 6.5 (ref 5.0–7.5)
PROTEIN UA: NEGATIVE
RBC UA: NEGATIVE
Specific Gravity, UA: <1.005 — ABNORMAL LOW (ref 1.005–1.030)
Urobilinogen, Ur: 0.2 mg/dL (ref 0.2–1.0)

## 2016-07-28 MED ORDER — LIDOCAINE 5 % EX OINT: 1.0000 "application " | TOPICAL_OINTMENT | Freq: Three times a day (TID) | CUTANEOUS | 1 refills | Status: DC | PRN

## 2016-07-28 MED ORDER — HYDROCORTISONE 2.5 % RE CREA: 1.0000 "application " | TOPICAL_CREAM | Freq: Two times a day (BID) | RECTAL | 3 refills | Status: DC

## 2016-07-28 NOTE — Progress Notes (Signed)
   HPI  Patient presents today here with dysuria and painful hemorrhoids.  Patient and her daughter state that she's had mild intermittent dysuria for about 2 days, she's been drinking more sodas than usual. Patient states that this is not unusual for her. He denies fever, chills, sweats.  They did complain of very painful hemorrhoids. She has some improvement with Anusol cream, they asked for additional medication if possible. She had psychotic reaction to narcotic pain medications. She was recently discharged from a mental hospital in [place].  PMH: Smoking status noted ROS: Per HPI  Objective: BP (!) 148/87   Pulse 85   Temp 97.3 F (36.3 C) (Oral)   Ht 5\' 1"  (1.549 m)   Wt 122 lb (55.3 kg)   BMI 23.05 kg/m  Gen: NAD, alert, cooperative with exam HEENT: NCAT CV: RRR, good S1/S2, no murmur Resp: CTABL, no wheezes, non-labored Abd: SNTND, BS present, no guarding or organomegaly Ext: No edema, warm Neuro: Alert and interactive  Assessment and plan:  # Dysuria Unclear whether this is unusual for the patient not, her urine does not appear to be consistent with an infection today Watchful waiting, low threshold for follow-up  # Rectal pain, internal prolapsed hemorrhoids, first-degree hemorrhoids Refilled Anusol, also given lidocaine ointment Follow up as needed  #HTN They state they are giving her only amlodpinine 5 mg, holding lisinopril 20 mg  Controlled today considering age No additional meds, continue 5 mg amlodipine for now    Orders Placed This Encounter  Procedures  . Urinalysis, Complete    Meds ordered this encounter  Medications  . lidocaine (XYLOCAINE) 5 % ointment    Sig: Apply 1 application topically 3 (three) times daily as needed.    Dispense:  35.44 g    Refill:  1  . hydrocortisone (PROCTOZONE-HC) 2.5 % rectal cream    Sig: Place 1 application rectally 2 (two) times daily.    Dispense:  30 g    Refill:  [place], MD [facility] 07/28/2016, 3:14 PM

## 2016-07-28 NOTE — Patient Instructions (Signed)
Great to see you!  I have sent hydrocortisone cream and licocaine ointment to the pharmacy for her hemorrhoids  Her urine looks good, Come back with any concerns

## 2016-07-31 ENCOUNTER — Telehealth: Payer: Self-pay | Admitting: Gastroenterology

## 2016-07-31 NOTE — Telephone Encounter (Signed)
Called [name] office. Pt didn't show for appt 05/06/16 (see phone note for 05/06/16, daughter had forgot about appt and was to call and reschedule). [name] office advised they would need another referral sent before they could see pt. Called and spoke to daughter Erin Mckay). Daughter said PCP had given pt pain med for hemorrhoids and increased it d/t not helping. Said pt had a mental breakdown caused by pain med and was admitted to mental hospital in [place]. Pt now is not taking any pain medication and screams all day because of her hemorrhoids. Daughter said she can see the hemorrhoids.  Erin Mckay, please advise. Is it ok to send another referral to [name] or does she need OV?

## 2016-07-31 NOTE — Telephone Encounter (Signed)
[phone]  PATIENT DAUGHTER CALLED WANTING TO GET HER BACK IN WITH THE SURGEON ANNA REFERRED HER TO A FEW MONTH'S BACK.  IS STILL HAVING PROBLEMS AND CAN NOT REMEMBER WHO THE SURGEON WAS.

## 2016-08-04 ENCOUNTER — Other Ambulatory Visit: Payer: Self-pay

## 2016-08-04 DIAGNOSIS — K649 Unspecified hemorrhoids: Secondary | ICD-10-CM

## 2016-08-04 NOTE — Telephone Encounter (Signed)
[facility] called. Pt has appt for 08/13/16 at 1:00pm with Dr. Rosana Hoes (surgeon re: hemorrhoids). Unable to contact daughter. Letter mailed.

## 2016-08-04 NOTE — Telephone Encounter (Signed)
Referral info faxed to [name] office. Tried to call daughter Erin Mckay) to inform, no answer and no voicemail.

## 2016-08-04 NOTE — Telephone Encounter (Signed)
Yes, may send another referral.

## 2016-08-13 NOTE — Telephone Encounter (Signed)
Erin Mckay at Dr. Rosana Mckay' office called and said pt's daughter called and rescheduled her appt. She was offered appt 08/20/16, but chose appt 09/04/16.

## 2016-08-27 ENCOUNTER — Ambulatory Visit (HOSPITAL_COMMUNITY): Payer: Medicare Other | Admitting: Hematology & Oncology

## 2016-09-01 DIAGNOSIS — Z923 Personal history of irradiation: Secondary | ICD-10-CM | POA: Diagnosis not present

## 2016-09-01 DIAGNOSIS — F0390 Unspecified dementia without behavioral disturbance: Secondary | ICD-10-CM | POA: Diagnosis not present

## 2016-09-01 DIAGNOSIS — K5900 Constipation, unspecified: Secondary | ICD-10-CM | POA: Diagnosis not present

## 2016-09-01 DIAGNOSIS — C519 Malignant neoplasm of vulva, unspecified: Secondary | ICD-10-CM | POA: Diagnosis not present

## 2016-09-01 DIAGNOSIS — L292 Pruritus vulvae: Secondary | ICD-10-CM | POA: Diagnosis not present

## 2016-09-17 ENCOUNTER — Encounter (HOSPITAL_COMMUNITY): Payer: Medicare Other | Attending: Hematology & Oncology | Admitting: Oncology

## 2016-09-17 ENCOUNTER — Encounter (HOSPITAL_COMMUNITY): Payer: Self-pay

## 2016-09-17 VITALS — BP 183/77 | HR 74 | Temp 98.4°F | Resp 18 | Wt 131.1 lb

## 2016-09-17 DIAGNOSIS — C519 Malignant neoplasm of vulva, unspecified: Secondary | ICD-10-CM | POA: Diagnosis not present

## 2016-09-17 NOTE — Patient Instructions (Addendum)
[place] at [facility] Discharge Instructions  RECOMMENDATIONS MADE BY THE CONSULTANT AND ANY TEST RESULTS WILL BE SENT TO YOUR REFERRING PHYSICIAN.  You were seen today by Dr. Barron Schmid Follow up with [place] as needed.  Thank you for choosing [place] at [facility] to provide your oncology and hematology care.  To afford each patient quality time with our provider, please arrive at least 15 minutes before your scheduled appointment time.    If you have a lab appointment with the [facility] please come in thru the  Main Entrance and check in at the main information desk  You need to re-schedule your appointment should you arrive 10 or more minutes late.  We strive to give you quality time with our providers, and arriving late affects you and other patients whose appointments are after yours.  Also, if you no show three or more times for appointments you may be dismissed from the clinic at the providers discretion.     Again, thank you for choosing [facility].  Our hope is that these requests will decrease the amount of time that you wait before being seen by our physicians.       _____________________________________________________________  Should you have questions after your visit to [facility], please contact our office at [phone] between the hours of 8:30 a.m. and 4:30 p.m.  Voicemails left after 4:30 p.m. will not be returned until the following business day.  For prescription refill requests, have your pharmacy contact our office.       Resources For Cancer Patients and their Caregivers ? American Cancer Society: Can assist with transportation, wigs, general needs, runs Look Good Feel Better.        718-587-2671 ? Cancer Care: Provides financial assistance, online support groups, medication/co-pay assistance.  1-800-813-HOPE (608)230-2658) ? [place] Assists [place]  cancer patients and their families through emotional , educational and financial support.  424-527-4275 ? [place] DSS Where to apply for food stamps, Medicaid and utility assistance. 947-064-4514 ? [facility]: Transportation to medical appointments. 206-146-0042 ? Social Security Administration: May apply for disability if have a Stage IV cancer. 959-248-4625 (845)811-8139 ? [employer], Disability and Transit Services: Assists with nutrition, care and transit needs. [place] Support Programs: @10RELATIVEDAYS @ > Cancer Support Group  2nd Tuesday of the month 1pm-2pm, Journey Room  > Creative Journey  3rd Tuesday of the month 1130am-1pm, Journey Room  > Look Good Feel Better  1st Wednesday of the month 10am-12 noon, Journey Room (Call [place] to register [phone])

## 2016-09-17 NOTE — Progress Notes (Signed)
[facility] NOTE  Patient Care Team: Claretta Fraise, MD as PCP - General (Family Medicine) Danie Binder, MD as Consulting Physician (Gastroenterology)  CHIEF COMPLAINTS/PURPOSE OF CONSULTATION:  Vulvar Cancer  No history exists.    HISTORY OF PRESENTING ILLNESS:  Erin Mckay 83 y.o. 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  Multiple positive for carcinoma in situ and invasive carcinoma. She was seen by Dr. Gery Pray in [place], [place]. Pt was then found to have a periurethral reccurrence of vulvar in May 2017 and underwent palliative radiation. She was treated with adjuvant radiation, unclear when she finished radiation treatments since I do not have these records.  After her diagnosis in 2014, she started experiencing hemorrhoids that were uncontrollable, in which the patient would scream in pain and start having "nervous breakdowns", for which she was placed in a mental facility. She is currently taking medication for her mental status, as well as amlodipine 10 mg, ProctoCream, Elocon and venlafaxine 75 mg. Her daughter is her caregiver and reports that she has dementia as well. She states that since the patient has been off all the pain meds she was placed on at the mental facility, her constipation has improved.   [name] presents to the cancer center with her daughter today. Her daughter gave all the history due to the patient's dementia.   She had 25 radiation treatments and has been itching in the vulvar area ever since. She still is struggling with painful hemorrhoids, but her daughter says that there are too many to get removed. She did have some banding of some hemorrhoids in the past. She is not taking pain medication other than tylenol. She uses hydrocortisone cream to help relieve the pain. Her daughter says  she screams in pain from the time she has her first bowel movement, until she goes to bed. Her bowel movements are soft. Denies constipation. One of the hemorrhoids is "cherry red" and large. She has been struggling with hemorrhoids since she was a child. She has seen Dr. Gala Romney about these hemorrhoids. She sees Dr. Audrie Lia for surgery and has an appointment scheduled for 09/23/16. Her gynecologist is Dr. Glo Herring.    She has some burning with urination. Denies vaginal bleeding, chest pain, SOB, loss of appetite, or any other concerns.   MEDICAL HISTORY:  Past Medical History:  Diagnosis Date  . Cataract    03-20-14 right eye" afraid to have surgery"  . 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 Frequent UTI   . Hard of hearing   . Hemorrhoids    remains a problem.  . Hypertension   . Measles   . Vision loss, bilateral   . Vulvar cancer ([place]) 02/09/14  . Vulvar cancer ([place]) 03/28/14    SURGICAL HISTORY: Past Surgical History:  Procedure Laterality Date  . APPENDECTOMY    . CATARACT EXTRACTION Left    "blinded" s/p cataract surgery, cataract on left -"afraid to have surgery"  . EYE SURGERY Left    cataract removal and was blinded.  Marland Kitchen FLEXIBLE SIGMOIDOSCOPY N/A 08/08/2014   Dr. Oneida Alar: moderate sized hemorrhoids Grade 2-3 with banding X 3. Mild diverticulosis in sigmoid colon  . FLEXIBLE SIGMOIDOSCOPY N/A 08/22/2014   3 small ulcers present where prior bands placed   . HEMORRHOID  BANDING N/A 08/08/2014   Procedure: HEMORRHOID BANDING;  Surgeon: Danie Binder, MD;  Location: [facility];  Service: Endoscopy;  Laterality: N/A;  . TUBAL LIGATION    . VULVECTOMY Right 03/28/2014   Procedure: WIDE LOCAL EXCISION OF RIGHT VULVAR;  Surgeon: Everitt Amber, MD;  Location: [facility];  Service: Gynecology;  Laterality: Right;    SOCIAL HISTORY: Social History   Social History  . Marital status: Widowed    Spouse name: N/A  . Number of children: N/A  . Years of  education: N/A   Occupational History  . Not on file.   Social History Main Topics  . Smoking status: Current Every Day Smoker    Packs/day: 1.00    Years: 48.00    Types: Cigarettes  . Smokeless tobacco: Former Systems developer     Comment: one pack daily  . Alcohol use No  . Drug use: No  . Sexual activity: Not Currently    Birth control/ protection: Post-menopausal   Other Topics Concern  . Not on file   Social History Narrative  . No narrative on file   She reports that she has been smoking Cigarettes.  She has a 48.00 pack-year smoking history. She has quit using smokeless tobacco. She reports that she does not drink alcohol or use drugs.  FAMILY HISTORY: Family History  Problem Relation Age of Onset  . Heart disease Mother   . Early death Father   . Anuerysm Sister     stomach  . Cancer Brother   . Hypertension Daughter   . Hyperlipidemia Daughter   . Cancer Son     bladder  . Heart disease Brother   . Hypertension Son   . Hypertension Son   . Hypertension Daughter   . Colon cancer Neg Hx    Her family history includes Anuerysm in her sister; Cancer in her brother and son; Early death in her father; Heart disease in her brother and mother; Hyperlipidemia in her daughter; Hypertension in her daughter, daughter, son, and son  ALLERGIES:  is allergic to pineapple; streptomycin; and macrolides and ketolides.  MEDICATIONS:  Current Outpatient Prescriptions  Medication Sig Dispense Refill  . acetaminophen (TYLENOL) 325 MG tablet Take by mouth.    . nystatin (MYCOSTATIN/NYSTOP) powder Apply topically.    Marland Kitchen amLODipine (NORVASC) 10 MG tablet Take 5 mg by mouth daily.     . Cholecalciferol (VITAMIN D3) 1000 units CAPS Take by mouth.    . Cyanocobalamin (RA VITAMIN B-12 TR) 1000 MCG TBCR Take by mouth.    . donepezil (ARICEPT) 5 MG tablet Take by mouth.    . folic acid (FOLVITE) 1 MG tablet Take by mouth.    . hydrALAZINE (APRESOLINE) 25 MG tablet Take by mouth.    .  hydrocortisone (PROCTOZONE-HC) 2.5 % rectal cream Place 1 application rectally 2 (two) times daily. 30 g 3  . levETIRAcetam (KEPPRA) 250 MG tablet Take by mouth.    . lidocaine (XYLOCAINE) 5 % ointment Apply 1 application topically 3 (three) times daily as needed. 35.44 g 1  . mometasone (ELOCON) 0.1 % cream Apply 1 application topically daily.    . Multiple Vitamins-Minerals (THERAGRAN-M PO) Take by mouth.    . OLANZapine (ZYPREXA) 2.5 MG tablet Take by mouth.     No current facility-administered medications for this visit.     Review of Systems  Constitutional: Negative.   HENT: Negative.   Eyes: Negative.   Respiratory: Negative.  Negative for shortness of  breath.   Cardiovascular: Negative.  Negative for chest pain.  Gastrointestinal: Negative for constipation.       Hemorrhoids.   Genitourinary: Positive for hematuria.  Musculoskeletal: Negative.   Skin: Negative.   Neurological: Negative.   Endo/Heme/Allergies: Negative.   Psychiatric/Behavioral: Positive for memory loss (dementia).  All other systems reviewed and are negative.  14 point ROS was done and is otherwise as detailed above or in HPI   PHYSICAL EXAMINATION: ECOG PERFORMANCE STATUS: 1  Vitals:   09/17/16 1431  BP: (!) 183/77  Pulse: 74  Resp: 18  Temp: 98.4 F (36.9 C)   Filed Weights   09/17/16 1431  Weight: 131 lb 1.6 oz (59.5 kg)   Physical Exam  Constitutional: She is well-developed, well-nourished, and in no distress.  HENT:  Head: Normocephalic and atraumatic.  Eyes: EOM are normal. Pupils are equal, round, and reactive to light.  Neck: Normal range of motion. Neck supple.  Cardiovascular: Normal rate, regular rhythm and normal heart sounds.   Pulmonary/Chest: Effort normal and breath sounds normal.  Abdominal: Soft. Bowel sounds are normal.  Genitourinary:  Genitourinary Comments: External hemorrhoid visible. Does not appear thrombosed.  No vulvar lesions seen on the labia minora or  majora. No vaginal discharge.  Musculoskeletal: Normal range of motion.  Neurological: She is alert. Gait normal.  Underline severe dementia. Alert, but not aware.   Skin: Skin is warm and dry.  Nursing note and vitals reviewed.  LABORATORY DATA:  I have reviewed the data as listed Lab Results  Component Value Date   WBC 8.2 05/18/2016   HGB 12.8 05/18/2016   HCT 39.0 05/18/2016   MCV 90.1 05/18/2016   PLT 229 05/18/2016   CMP     Component Value Date/Time   NA 138 05/18/2016 1556   K 3.5 05/18/2016 1556   CL 104 05/18/2016 1556   CO2 26 05/18/2016 1556   GLUCOSE 97 05/18/2016 1556   BUN 16 05/18/2016 1556   CREATININE 0.66 05/18/2016 1556   CALCIUM 9.6 05/18/2016 1556   PROT 6.9 05/18/2016 1556   ALBUMIN 3.3 (L) 05/18/2016 1556   AST 23 05/18/2016 1556   ALT 17 05/18/2016 1556   ALKPHOS 60 05/18/2016 1556   BILITOT 0.9 05/18/2016 1556   GFRNONAA >60 05/18/2016 1556   GFRAA >60 05/18/2016 1556   PATHOLOGY:    RADIOGRAPHIC STUDIES: I have personally reviewed the radiological images as listed and agreed with the findings in the report.  CT Abdomen Pelvis w/ Contrast 03/05/2016 IMPRESSION: Vagina and rectum protrude inferiorly. No obvious rectal mass detected. No surrounding inflammation.  Large hiatal hernia. Full extent not imaged. Rotation of stomach with body located superiorly.  Large left inguinal colon containing hernia (sigmoid colon/ascending colon) without obstruction.  Small bowel containing right inguinal hernia without obstruction.  Abdominal aorta aneurysm measuring up to 5.5 x 5 x 5.2 cm. Only portion of this was imaged on the prior pelvic CT when the aneurysm measured 5 x 5 cm. Narrowing of the femoral arteries and iliac arteries.  Mild intrahepatic and extrahepatic biliary duct prominence may be related to patient's age without obstructing lesion noted.  2.5 cm gallstone.  ASSESSMENT & PLAN:  FIGO stage 1b (pT1B, pNX) vulvar  cancer status post excision of the right labia minora in 2014 with surgical path showing grade 2 moderately differentiated squamous cell carcinoma.  Pt was then found to have a periurethral reccurrence of vulvar in May 2017 and underwent palliative radiation.  I have encouraged  her to speak to a surgeon for hemorrhoidectomy, especially of the ones that are thrombosed.   I have requested that she go back to see her gynecologist, Dr. Glo Herring, for continued follow up with regular exams. No evidence of recurrence on exam today. Continue follow up with her radiation oncology.  I recommended Metamucil to help prevent constipation.   She will follow up with her gynecologist and radiologist. RTN PRN.  All questions were answered. The patient knows to call the clinic with any problems, questions or concerns.  I spent 30 minutes counseling the patient face to face. The total time spent in the appointment was 30 minutes and more than 50% was on counseling and review of test results  This document serves as a record of services personally performed by Twana First, MD. It was created on her behalf by Martinique Casey, a trained medical scribe. The creation of this record is based on the scribe's personal observations and the provider's statements to them. This document has been checked and approved by the attending provider.  I have reviewed the above documentation for accuracy and completeness and I agree with the above.  This note was electronically signed.    Martinique M Casey  09/17/2016 2:39 PM

## 2016-09-23 DIAGNOSIS — K6289 Other specified diseases of anus and rectum: Secondary | ICD-10-CM | POA: Diagnosis not present

## 2016-10-03 ENCOUNTER — Ambulatory Visit: Payer: Medicare Other | Admitting: Family Medicine

## 2016-10-21 ENCOUNTER — Ambulatory Visit: Payer: Medicare Other | Admitting: Physician Assistant

## 2016-10-22 ENCOUNTER — Encounter: Payer: Self-pay | Admitting: Family Medicine

## 2016-10-23 ENCOUNTER — Ambulatory Visit: Payer: Medicare Other | Admitting: Family

## 2016-10-27 ENCOUNTER — Ambulatory Visit: Payer: Medicare Other | Admitting: Physician Assistant

## 2016-10-27 DIAGNOSIS — T17390A Other foreign object in larynx causing asphyxiation, initial encounter: Secondary | ICD-10-CM | POA: Diagnosis not present

## 2016-10-27 DIAGNOSIS — M810 Age-related osteoporosis without current pathological fracture: Secondary | ICD-10-CM | POA: Diagnosis not present

## 2016-10-27 DIAGNOSIS — J189 Pneumonia, unspecified organism: Secondary | ICD-10-CM | POA: Diagnosis not present

## 2016-10-27 DIAGNOSIS — K6289 Other specified diseases of anus and rectum: Secondary | ICD-10-CM | POA: Diagnosis not present

## 2016-10-27 DIAGNOSIS — F17200 Nicotine dependence, unspecified, uncomplicated: Secondary | ICD-10-CM | POA: Diagnosis not present

## 2016-10-27 DIAGNOSIS — Z79899 Other long term (current) drug therapy: Secondary | ICD-10-CM | POA: Diagnosis not present

## 2016-10-27 DIAGNOSIS — I517 Cardiomegaly: Secondary | ICD-10-CM | POA: Diagnosis not present

## 2016-10-27 DIAGNOSIS — R55 Syncope and collapse: Secondary | ICD-10-CM | POA: Diagnosis not present

## 2016-10-27 DIAGNOSIS — I10 Essential (primary) hypertension: Secondary | ICD-10-CM | POA: Diagnosis not present

## 2016-10-27 DIAGNOSIS — F0390 Unspecified dementia without behavioral disturbance: Secondary | ICD-10-CM | POA: Diagnosis not present

## 2016-10-27 DIAGNOSIS — Z8589 Personal history of malignant neoplasm of other organs and systems: Secondary | ICD-10-CM | POA: Diagnosis not present

## 2016-10-27 DIAGNOSIS — I214 Non-ST elevation (NSTEMI) myocardial infarction: Secondary | ICD-10-CM | POA: Diagnosis not present

## 2016-10-28 DIAGNOSIS — R0989 Other specified symptoms and signs involving the circulatory and respiratory systems: Secondary | ICD-10-CM | POA: Diagnosis not present

## 2016-10-28 DIAGNOSIS — J189 Pneumonia, unspecified organism: Secondary | ICD-10-CM | POA: Diagnosis not present

## 2016-10-29 ENCOUNTER — Ambulatory Visit: Payer: Medicare Other | Admitting: Family Medicine

## 2016-10-30 ENCOUNTER — Encounter: Payer: Self-pay | Admitting: Family Medicine

## 2016-11-04 ENCOUNTER — Ambulatory Visit (INDEPENDENT_AMBULATORY_CARE_PROVIDER_SITE_OTHER): Payer: Medicare Other | Admitting: Family Medicine

## 2016-11-04 ENCOUNTER — Encounter: Payer: Self-pay | Admitting: Family Medicine

## 2016-11-04 VITALS — BP 138/80 | HR 81 | Temp 99.1°F | Ht 61.0 in | Wt 138.0 lb

## 2016-11-04 DIAGNOSIS — I2510 Atherosclerotic heart disease of native coronary artery without angina pectoris: Secondary | ICD-10-CM

## 2016-11-04 DIAGNOSIS — K627 Radiation proctitis: Secondary | ICD-10-CM | POA: Diagnosis not present

## 2016-11-04 DIAGNOSIS — K6289 Other specified diseases of anus and rectum: Secondary | ICD-10-CM

## 2016-11-04 DIAGNOSIS — I209 Angina pectoris, unspecified: Secondary | ICD-10-CM | POA: Diagnosis not present

## 2016-11-04 MED ORDER — MESALAMINE 1000 MG RE SUPP: 1000.0000 mg | Freq: Every day | RECTAL | 12 refills | Status: DC

## 2016-11-04 MED ORDER — HYDROCORTISONE ACETATE 30 MG RE SUPP: 30.0000 mg | Freq: Two times a day (BID) | RECTAL | 2 refills | Status: DC

## 2016-11-04 NOTE — Progress Notes (Signed)
Subjective:  Patient ID: Erin Mckay, female    DOB: 04-12-1924  Age: 83 y.o. MRN: [id number]  CC: Hemorrhoids (pt here today c/o rectal pain and hemorrhoids)   HPI Erin Mckay presents for In today for evaluation of her hemorrhoids area she also brings in information from her recent hospitalization at [facility]. She was found to have an NSTEMI after a 40 minute course of spasmodic choking when she swallowed a Tylenol and it stuck in her throat. That situation appears stable now. Today she is more concerned about the pain in the rectum. When she has a bowel movement her daughter says that she can see red beefy tissue protruding from the rectum. They recently saw a surgeon who said there was nothing he could do. They're concerned that this could represent a prolapse versus internal hemorrhoids. While hospitalized she was given a prescription for gabapentin. She started this 6 days ago. She is currently taking 1 at bedtime. 100 mg capsule    History Erin Mckay has a past medical history of Cataract; Complication of anesthesia; Frequent UTI; Hard of hearing; Hemorrhoids; Hypertension; Measles; Vision loss, bilateral; Vulvar cancer ([place]) (02/09/14); and Vulvar cancer ([place]) (03/28/14).   She has a past surgical history that includes Tubal ligation; Appendectomy; Eye surgery (Left); Cataract extraction (Left); Vulvectomy (Right, 03/28/2014); Flexible sigmoidoscopy (N/A, 08/08/2014); Hemorrhoid banding (N/A, 08/08/2014); and Flexible sigmoidoscopy (N/A, 08/22/2014).   Her family history includes Anuerysm in her sister; Cancer in her brother and son; Early death in her father; Heart disease in her brother and mother; Hyperlipidemia in her daughter; Hypertension in her daughter, daughter, son, and son.She reports that she has been smoking Cigarettes.  She has a 48.00 pack-year smoking history. She has quit using smokeless tobacco. She reports that she does not drink alcohol or use  drugs.    ROS Review of Systems  Constitutional: Negative for activity change, appetite change and fever.  HENT: Negative for congestion, rhinorrhea and sore throat.   Eyes: Negative for visual disturbance.  Respiratory: Negative for cough and shortness of breath.   Cardiovascular: Negative for chest pain and palpitations.  Gastrointestinal: Positive for rectal pain. Negative for abdominal pain, diarrhea and nausea.  Genitourinary: Negative for dysuria.  Musculoskeletal: Negative for arthralgias and myalgias.    Objective:  BP 138/80   Pulse 81   Temp 99.1 F (37.3 C) (Oral)   Ht 5\' 1"  (1.549 m)   Wt 138 lb (62.6 kg)   BMI 26.07 kg/m   BP Readings from Last 3 Encounters:  11/04/16 138/80  09/17/16 (!) 183/77  07/28/16 (!) 148/87    Wt Readings from Last 3 Encounters:  11/04/16 138 lb (62.6 kg)  09/17/16 131 lb 1.6 oz (59.5 kg)  07/28/16 122 lb (55.3 kg)     Physical Exam  Constitutional: She is oriented to person, place, and time. She appears well-developed and well-nourished. No distress.  HENT:  Head: Normocephalic and atraumatic.  Eyes: Conjunctivae are normal. Pupils are equal, round, and reactive to light.  Neck: Normal range of motion. Neck supple. No thyromegaly present.  Cardiovascular: Normal rate, regular rhythm and normal heart sounds.   No murmur heard. Pulmonary/Chest: Effort normal and breath sounds normal. No respiratory distress. She has no wheezes. She has no rales.  Abdominal: Soft. Bowel sounds are normal. She exhibits no distension. There is no tenderness.  Musculoskeletal: Normal range of motion.  Lymphadenopathy:    She has no cervical adenopathy.  Neurological: She is alert and oriented  to person, place, and time.  Skin: Skin is warm and dry.  Psychiatric: She has a normal mood and affect. Her behavior is normal. Judgment and thought content normal.      Assessment & Plan:   Erin Mckay was seen today for hemorrhoids.  Diagnoses and all  orders for this visit:  Proctitis, radiation  Rectal pain  ASCVD (arteriosclerotic cardiovascular disease)  Angina pectoris ([place])  Other orders -     mesalamine (CANASA) 1000 MG suppository; Place 1 suppository (1,000 mg total) rectally at bedtime. -     HYDROCORTISONE ACE, RECTAL, (PROCTOCORT) 30 MG SUPP; Place 1 suppository (30 mg total) rectally 2 (two) times daily.       I have discontinued Ms. Bumbaugh's mometasone, lidocaine, Vitamin D3, Cyanocobalamin, donepezil, folic acid, hydrALAZINE, levETIRAcetam, Multiple Vitamins-Minerals (THERAGRAN-M PO), nystatin, and OLANZapine. I am also having her start on mesalamine and HYDROCORTISONE ACE (RECTAL). Additionally, I am having her maintain her amLODipine, hydrocortisone, acetaminophen, aspirin EC, and gabapentin.  Allergies as of 11/04/2016      Reactions   Pineapple Swelling   Streptomycin    Any mycin drugs, causes numbness all over body   Macrolides And Ketolides       Medication List       Accurate as of 11/04/16  6:17 PM. Always use your most recent med list.          acetaminophen 325 MG tablet Commonly known as:  TYLENOL Take by mouth.   amLODipine 10 MG tablet Commonly known as:  NORVASC Take 5 mg by mouth daily.   aspirin EC 81 MG tablet Take 81 mg by mouth daily.   gabapentin 100 MG capsule Commonly known as:  NEURONTIN Take 100 mg by mouth as directed.   hydrocortisone 2.5 % rectal cream Commonly known as:  PROCTOZONE-HC Place 1 application rectally 2 (two) times daily.   HYDROCORTISONE ACE (RECTAL) 30 MG Supp Commonly known as:  PROCTOCORT Place 1 suppository (30 mg total) rectally 2 (two) times daily.   mesalamine 1000 MG suppository Commonly known as:  CANASA Place 1 suppository (1,000 mg total) rectally at bedtime.        Follow-up: Return in about 6 weeks (around 12/16/2016).  Claretta Fraise, M.D.

## 2016-11-26 ENCOUNTER — Encounter: Payer: Self-pay | Admitting: Family Medicine

## 2016-11-26 ENCOUNTER — Ambulatory Visit (INDEPENDENT_AMBULATORY_CARE_PROVIDER_SITE_OTHER): Payer: Medicare Other | Admitting: Family Medicine

## 2016-11-26 VITALS — BP 151/80 | HR 83 | Temp 98.2°F | Ht 61.0 in | Wt 135.0 lb

## 2016-11-26 DIAGNOSIS — I2510 Atherosclerotic heart disease of native coronary artery without angina pectoris: Secondary | ICD-10-CM

## 2016-11-26 DIAGNOSIS — R399 Unspecified symptoms and signs involving the genitourinary system: Secondary | ICD-10-CM

## 2016-11-26 LAB — URINALYSIS, COMPLETE
Bilirubin, UA: NEGATIVE
GLUCOSE, UA: NEGATIVE
KETONES UA: NEGATIVE
Nitrite, UA: NEGATIVE
PROTEIN UA: NEGATIVE
RBC, UA: NEGATIVE
SPEC GRAV UA: 1.015 (ref 1.005–1.030)
Urobilinogen, Ur: 0.2 mg/dL (ref 0.2–1.0)
pH, UA: 7 (ref 5.0–7.5)

## 2016-11-26 LAB — MICROSCOPIC EXAMINATION: RBC MICROSCOPIC, UA: NONE SEEN /HPF (ref 0–?)

## 2016-11-26 NOTE — Progress Notes (Signed)
Subjective:  Patient ID: Erin Mckay, female    DOB: 21-Mar-1924  Age: 83 y.o. MRN: [id number]  CC: Urinary Tract Infection   HPI Erin Mckay presents for burning with urination and frequency for several days. Denies fever . No flank pain. No nausea, vomiting.   History Erin Mckay has a past medical history of Cataract; Complication of anesthesia; Frequent UTI; Hard of hearing; Hemorrhoids; Hypertension; Measles; Vision loss, bilateral; Vulvar cancer ([place]) (02/09/14); and Vulvar cancer ([place]) (03/28/14).   She has a past surgical history that includes Tubal ligation; Appendectomy; Eye surgery (Left); Cataract extraction (Left); Vulvectomy (Right, 03/28/2014); Flexible sigmoidoscopy (N/A, 08/08/2014); Hemorrhoid banding (N/A, 08/08/2014); and Flexible sigmoidoscopy (N/A, 08/22/2014).   Her family history includes Anuerysm in her sister; Cancer in her brother and son; Early death in her father; Heart disease in her brother and mother; Hyperlipidemia in her daughter; Hypertension in her daughter, daughter, son, and son.She reports that she has been smoking Cigarettes.  She has a 48.00 pack-year smoking history. She has quit using smokeless tobacco. She reports that she does not drink alcohol or use drugs.  Current Outpatient Prescriptions on File Prior to Visit  Medication Sig Dispense Refill  . acetaminophen (TYLENOL) 325 MG tablet Take by mouth.    Erin Mckay Kitchen amLODipine (NORVASC) 10 MG tablet Take 5 mg by mouth daily.     Erin Mckay Kitchen aspirin EC 81 MG tablet Take 81 mg by mouth daily.    Erin Mckay Kitchen gabapentin (NEURONTIN) 100 MG capsule Take 100 mg by mouth as directed.    . mesalamine (CANASA) 1000 MG suppository Place 1 suppository (1,000 mg total) rectally at bedtime. 30 suppository 12   No current facility-administered medications on file prior to visit.     ROS Review of Systems  Constitutional: Negative for chills, diaphoresis and fever.  HENT: Negative for congestion.   Eyes: Negative for visual  disturbance.  Respiratory: Negative for cough and shortness of breath.   Cardiovascular: Negative for chest pain and palpitations.  Gastrointestinal: Negative for constipation, diarrhea and nausea.  Genitourinary: Positive for dysuria, frequency and urgency. Negative for decreased urine volume, flank pain, hematuria, menstrual problem and pelvic pain.  Musculoskeletal: Negative for arthralgias and joint swelling.  Skin: Negative for rash.  Neurological: Negative for dizziness and numbness.    Objective:  BP (!) 151/80   Pulse 83   Temp 98.2 F (36.8 C) (Oral)   Ht 5\' 1"  (1.549 m)   Wt 135 lb (61.2 kg)   BMI 25.51 kg/m   Physical Exam  Constitutional: She is oriented to person, place, and time. She appears well-developed and well-nourished.  HENT:  Head: Normocephalic and atraumatic.  Cardiovascular: Normal rate and regular rhythm.   No murmur heard. Pulmonary/Chest: Effort normal and breath sounds normal.  Abdominal: Soft. Bowel sounds are normal. She exhibits no mass. There is no tenderness. There is no rebound and no guarding.  Musculoskeletal: She exhibits no tenderness.  Neurological: She is alert and oriented to person, place, and time.  Skin: Skin is warm and dry.  Psychiatric: She has a normal mood and affect. Her behavior is normal.    Assessment & Plan:   [name] was seen today for urinary tract infection.  Diagnoses and all orders for this visit:  UTI symptoms -     Urinalysis, Complete  Other orders -     Microscopic Examination   I am having [name] maintain her amLODipine, acetaminophen, aspirin EC, gabapentin, and mesalamine.  No orders of the defined  types were placed in this encounter.    Follow-up: Return if symptoms worsen or fail to improve.  Claretta Fraise, M.D.

## 2016-11-27 ENCOUNTER — Telehealth: Payer: Self-pay | Admitting: *Deleted

## 2016-11-27 DIAGNOSIS — K640 First degree hemorrhoids: Secondary | ICD-10-CM

## 2016-11-27 DIAGNOSIS — N3090 Cystitis, unspecified without hematuria: Secondary | ICD-10-CM

## 2016-11-27 NOTE — Telephone Encounter (Signed)
Let us know if burning still ongoing, can bring urine culture back. Should have refills of all suppositories at the pharmacy.

## 2016-11-28 MED ORDER — AMOXICILLIN 250 MG PO CAPS: 250.0000 mg | ORAL_CAPSULE | Freq: Three times a day (TID) | ORAL | 0 refills | Status: DC

## 2016-11-28 MED ORDER — HYDROCORTISONE 2.5 % RE CREA: 1.0000 "application " | TOPICAL_CREAM | Freq: Two times a day (BID) | RECTAL | 3 refills | Status: DC

## 2016-11-28 NOTE — Telephone Encounter (Signed)
Spoke with daughter. Symptoms unchanged form baseline. Has constant burning, itching irritation in vaginal area from vulvar cancer and rectal irritation from hemorrhoids. UA with 6-10 WBCs, neg nitrites. No culture data. Antibiotics make symptoms worse. No fevers, no abd pain, slight cough today otherwise her normal self. Has appt with gyn in 4 days. Will send in amox 250mg  take TID x3 days for pt to start if symptoms gets worse. Otherwise can have urine rechecked next week and started on antibiotics if needed. Daughter in agreement with plan. Any worsening needs to be seen.

## 2016-11-28 NOTE — Telephone Encounter (Signed)
Daughter aware there are still refills on the suppository's left at pharmacy. Sates pt has had vulvar cancer so pt always itches and burns in her vagina. Daughter states that [facility] was supposed to call her in a low dose of amoxicillin. Asked if she could bring in a urine so we can send it for a culture and daughter states that she would not be able to do that and she would leave a urine with Dr. Glo Herring when she goes on Wednesday. Wanting to know if a low does of amoxicillin can be called in until her apt Wednesday. Please advise

## 2016-12-03 ENCOUNTER — Encounter: Payer: Self-pay | Admitting: Obstetrics and Gynecology

## 2016-12-03 ENCOUNTER — Ambulatory Visit (INDEPENDENT_AMBULATORY_CARE_PROVIDER_SITE_OTHER): Payer: Medicare Other | Admitting: Obstetrics and Gynecology

## 2016-12-03 VITALS — BP 182/90 | HR 92 | Ht 60.0 in | Wt 132.0 lb

## 2016-12-03 DIAGNOSIS — I2510 Atherosclerotic heart disease of native coronary artery without angina pectoris: Secondary | ICD-10-CM | POA: Diagnosis not present

## 2016-12-03 DIAGNOSIS — R300 Dysuria: Secondary | ICD-10-CM

## 2016-12-03 LAB — POCT URINALYSIS DIPSTICK
Blood, UA: NEGATIVE
GLUCOSE UA: NEGATIVE
Ketones, UA: NEGATIVE
Nitrite, UA: NEGATIVE
Protein, UA: NEGATIVE

## 2016-12-03 NOTE — Progress Notes (Signed)
[facility] Visit  @DATE @            Patient name: Erin Mckay MRN [id number]  Date of birth: 08/14/1924  CC & HPI:  Erin Mckay is a 83 y.o. female presenting today complaining of urinary frequency and vulvar burning and pain. She takes Benadryl with relief to the vulvar irritation.   Daughter also reports 2 inguinal hernias and an aneurysm but was told they are inoperable.   ROS:  ROS +frequency +vulvar burning and pain +hernia   Pertinent History Reviewed:   Reviewed: Significant for vulvectomy s/p vulvar cancer Medical         Past Medical History:  Diagnosis Date  . Cataract    03-20-14 right eye" afraid to have surgery"  . 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 Dementia   . Frequent UTI   . Hard of hearing   . Hemorrhoids    remains a problem.  . Hypertension   . Measles   . Vision loss, bilateral   . Vulvar cancer ([place]) 02/09/14  . Vulvar cancer ([place]) 03/28/14                              Surgical Hx:    Past Surgical History:  Procedure Laterality Date  . APPENDECTOMY    . CATARACT EXTRACTION Left    "blinded" s/p cataract surgery, cataract on left -"afraid to have surgery"  . EYE SURGERY Left    cataract removal and was blinded.  [facility] FLEXIBLE SIGMOIDOSCOPY N/A 08/08/2014   Dr. Oneida Alar: moderate sized hemorrhoids Grade 2-3 with banding X 3. Mild diverticulosis in sigmoid colon  . FLEXIBLE SIGMOIDOSCOPY N/A 08/22/2014   3 small ulcers present where prior bands placed   . HEMORRHOID BANDING N/A 08/08/2014   Procedure: HEMORRHOID BANDING;  Surgeon: Danie Binder, MD;  Location: [facility];  Service: Endoscopy;  Laterality: N/A;  . TUBAL LIGATION    . VULVECTOMY Right 03/28/2014   Procedure: WIDE LOCAL EXCISION OF RIGHT VULVAR;  Surgeon: Everitt Amber, MD;  Location: [facility];  Service: Gynecology;  Laterality: Right;   Medications: Reviewed & Updated - see associated section                       Current  Outpatient Prescriptions:  .  acetaminophen (TYLENOL) 500 MG tablet, Take 1,000 mg by mouth as needed., Disp: , Rfl:  .  amLODipine (NORVASC) 10 MG tablet, Take 5 mg by mouth daily. , Disp: , Rfl:  .  aspirin EC 81 MG tablet, Take 81 mg by mouth daily., Disp: , Rfl:  .  diphenhydrAMINE (BENADRYL) 25 MG tablet, Take 25 mg by mouth as needed., Disp: , Rfl:  .  gabapentin (NEURONTIN) 100 MG capsule, Take 100 mg by mouth as directed., Disp: , Rfl:  .  hydrocortisone (PROCTOZONE-HC) 2.5 % rectal cream, Place 1 application rectally 2 (two) times daily. (Patient taking differently: Place 1 application rectally. 2-3 times weekly), Disp: 30 g, Rfl: 3 .  ibuprofen (ADVIL,MOTRIN) 200 MG tablet, Take 200 mg by mouth as needed., Disp: , Rfl:  .  polyethylene glycol powder (MIRALAX) powder, Take 1 Container by mouth as needed., Disp: , Rfl:  .  amoxicillin (AMOXIL) 250 MG capsule, Take 1 capsule (250 mg total) by mouth 3 (three) times daily., Disp: 9 capsule, Rfl: 0  Social History: Reviewed -  reports that she has been smoking Cigarettes.  She has a 72.00 pack-year smoking history. She has quit using smokeless tobacco. Her smokeless tobacco use included Chew.  Objective Findings:  Vitals: Blood pressure (!) 182/90, pulse 92, height 5' (1.524 m), weight 132 lb (59.9 kg).  Physical Examination: General appearance - alert, well appearing, and in no distress Mental status - alert, oriented to person, place, and time  Pelvic - 10 cm x 8 cm hard, partially-reducible right inguinal hernia. Left inguinal hernia that is reducible VULVA: Post radiation changes in the vagina but no evidence of return of vulvar cancer   Assessment & Plan:   A:  1.inguinal hernias, x 2, huge, at risk of incarceration due to size. 2. No evident of cancer recurrence  3. Post radiation itching, controlled with benadryl  P:  1. No GYN treatment 2. Continue benadryl 3. Try to get a general surgeon to evaluate hernias to reduce  the risk of incarceration  Given pt's advanced age, will need to refer to surgeon and anesthesia service comfortable with pt's advanced age and comorbidity.   By signing my name below, I, Sonum Patel, attest that this documentation has been prepared under the direction and in the presence of Jonnie Kind, MD. Electronically Signed: Sonum Patel, Education administrator. 12/03/16. 4:42 PM.  I personally performed the services described in this documentation, which was SCRIBED in my presence. The recorded information has been reviewed and considered accurate. It has been edited as necessary during review. Jonnie Kind, MD

## 2016-12-10 ENCOUNTER — Encounter: Payer: Self-pay | Admitting: Family Medicine

## 2016-12-10 ENCOUNTER — Ambulatory Visit (INDEPENDENT_AMBULATORY_CARE_PROVIDER_SITE_OTHER): Payer: Medicare Other | Admitting: Family Medicine

## 2016-12-10 VITALS — BP 137/86 | HR 85 | Temp 98.9°F | Ht 60.0 in | Wt 132.0 lb

## 2016-12-10 DIAGNOSIS — H1032 Unspecified acute conjunctivitis, left eye: Secondary | ICD-10-CM | POA: Diagnosis not present

## 2016-12-10 DIAGNOSIS — I2510 Atherosclerotic heart disease of native coronary artery without angina pectoris: Secondary | ICD-10-CM

## 2016-12-10 DIAGNOSIS — B029 Zoster without complications: Secondary | ICD-10-CM | POA: Diagnosis not present

## 2016-12-10 MED ORDER — BACITRACIN-POLYMYXIN B 500-10000 UNIT/GM OP OINT: 1.0000 "application " | TOPICAL_OINTMENT | Freq: Two times a day (BID) | OPHTHALMIC | 0 refills | Status: DC

## 2016-12-10 MED ORDER — VALACYCLOVIR HCL 1 G PO TABS: 1000.0000 mg | ORAL_TABLET | Freq: Two times a day (BID) | ORAL | 0 refills | Status: DC

## 2016-12-10 NOTE — Progress Notes (Signed)
BP 137/86   Pulse 85   Temp 98.9 F (37.2 C) (Oral)   Ht 5' (1.524 m)   Wt 132 lb (59.9 kg)   BMI 25.78 kg/m    Subjective:    Patient ID: Erin Mckay, female    DOB: Jan 05, 1924, 83 y.o.   MRN: [id number]  HPI: Erin Mckay is a 83 y.o. female presenting on 12/10/2016 for Lesion on buttock (had same thing one year ago, was treated by Dr.Butler for shingles which completley tried it up.) and Left eye red and draining   HPI Rash on buttocks Patient has a rash on the right buttocks near the midline that she has had what before about a year ago. She said at that time they thought it looked like shingles and they treated as such and it got better. She says the rash is very pruritic but not painful and has not had any drainage. It has been there about 3 days this time. She denies any fevers or chills.  Left eye drainage and matting Over the past couple days patient has started to develop some matting and drainage out of her left eye. Patient's daughter does note that she gets a lot of allergist time year but then sometimes it will build up to where she gets blood drainage and matting. She has been doing warm compresses which seemed to help but are not resolving issue. She denies any visual disturbance or pain in the eye itself.  Relevant past medical, surgical, family and social history reviewed and updated as indicated. Interim medical history since our last visit reviewed. Allergies and medications reviewed and updated.  Review of Systems  Constitutional: Negative for chills and fever.  HENT: Negative for congestion, ear discharge and ear pain.   Eyes: Positive for discharge, redness and itching. Negative for visual disturbance.  Respiratory: Negative for chest tightness and shortness of breath.   Cardiovascular: Negative for chest pain and leg swelling.  Genitourinary: Negative for difficulty urinating and dysuria.  Musculoskeletal: Negative for back pain and gait problem.    Skin: Positive for color change and rash.  Neurological: Negative for light-headedness and headaches.  Psychiatric/Behavioral: Negative for agitation and behavioral problems.  All other systems reviewed and are negative.   Per HPI unless specifically indicated above        Objective:    BP 137/86   Pulse 85   Temp 98.9 F (37.2 C) (Oral)   Ht 5' (1.524 m)   Wt 132 lb (59.9 kg)   BMI 25.78 kg/m   Wt Readings from Last 3 Encounters:  12/10/16 132 lb (59.9 kg)  12/03/16 132 lb (59.9 kg)  11/26/16 135 lb (61.2 kg)    Physical Exam  Constitutional: She is oriented to person, place, and time. She appears well-developed and well-nourished. No distress.  HENT:  Right Ear: External ear normal.  Left Ear: External ear normal.  Nose: Nose normal.  Mouth/Throat: Oropharynx is clear and moist.  Eyes: Left eye exhibits discharge. Left conjunctiva is injected.  Cardiovascular: Normal rate, regular rhythm, normal heart sounds and intact distal pulses.   No murmur heard. Pulmonary/Chest: Effort normal and breath sounds normal. No respiratory distress. She has no wheezes.  Musculoskeletal: Normal range of motion. She exhibits no edema or tenderness.  Neurological: She is alert and oriented to person, place, and time. Coordination normal.  Skin: Skin is warm and dry. No rash noted. She is not diaphoretic.  Psychiatric: She has a normal mood and  affect. Her behavior is normal.  Nursing note and vitals reviewed.       Assessment & Plan:   Problem List Items Addressed This Visit    None    Visit Diagnoses    Herpes zoster without complication    -  Primary   Relevant Medications   valACYclovir (VALTREX) 1000 MG tablet   Acute conjunctivitis of left eye, unspecified acute conjunctivitis type       Relevant Medications   bacitracin-polymyxin b (POLYSPORIN) ophthalmic ointment       Follow up plan: Return if symptoms worsen or fail to improve.  Counseling provided for all of  the vaccine components No orders of the defined types were placed in this encounter.   Caryl Pina, MD [facility] 12/10/2016, 3:23 PM

## 2016-12-15 ENCOUNTER — Encounter: Payer: Self-pay | Admitting: Family Medicine

## 2016-12-15 ENCOUNTER — Ambulatory Visit (INDEPENDENT_AMBULATORY_CARE_PROVIDER_SITE_OTHER): Payer: Medicare Other | Admitting: Family Medicine

## 2016-12-15 VITALS — BP 144/75 | HR 80 | Temp 98.0°F | Ht 60.0 in | Wt 131.0 lb

## 2016-12-15 DIAGNOSIS — I2510 Atherosclerotic heart disease of native coronary artery without angina pectoris: Secondary | ICD-10-CM

## 2016-12-15 DIAGNOSIS — L739 Follicular disorder, unspecified: Secondary | ICD-10-CM | POA: Diagnosis not present

## 2016-12-15 DIAGNOSIS — L03213 Periorbital cellulitis: Secondary | ICD-10-CM | POA: Diagnosis not present

## 2016-12-15 MED ORDER — SULFAMETHOXAZOLE-TRIMETHOPRIM 800-160 MG PO TABS: 1.0000 | ORAL_TABLET | Freq: Two times a day (BID) | ORAL | 0 refills | Status: DC

## 2016-12-15 NOTE — Progress Notes (Signed)
BP (!) 171/85   Pulse 80   Temp 98 F (36.7 C) (Oral)   Ht 5' (1.524 m)   Wt 131 lb (59.4 kg)   BMI 25.58 kg/m    Subjective:    Patient ID: Erin Mckay, female    DOB: Oct 28, 1923, 83 y.o.   MRN: [id number]  HPI: Erin Mckay is a 83 y.o. female presenting on 12/15/2016 for Herpes Zoster and Eye Problem (left eye not looking any better)   HPI Follow-up skin lesion Patient still has the same skin lesion in the same area on her right buttock near the gluteal cleft. She feels like it has not improved or changed much despite the treatment for herpes. She denies any tenderness or irritation or itching or burning. She denies any fevers or chills.  Left eye irritation and redness. Patient still has irritation around her left eye with some clear drainage and it's been very pruritic. She has not had any improvement despite the eyedrops that she had used. She was also using warm compresses which were not helping as much. She does have an ophthalmologist and she will go back and see them.  Relevant past medical, surgical, family and social history reviewed and updated as indicated. Interim medical history since our last visit reviewed. Allergies and medications reviewed and updated.  Review of Systems  Constitutional: Negative for chills and fever.  HENT: Negative for congestion, ear discharge and ear pain.   Eyes: Positive for discharge, redness and itching. Negative for visual disturbance.  Respiratory: Negative for chest tightness and shortness of breath.   Cardiovascular: Negative for chest pain and leg swelling.  Genitourinary: Negative for difficulty urinating and dysuria.  Musculoskeletal: Negative for back pain and gait problem.  Skin: Negative for rash.  Neurological: Negative for light-headedness and headaches.  Psychiatric/Behavioral: Negative for agitation and behavioral problems.  All other systems reviewed and are negative.   Per HPI unless specifically  indicated above      Objective:    BP (!) 171/85   Pulse 80   Temp 98 F (36.7 C) (Oral)   Ht 5' (1.524 m)   Wt 131 lb (59.4 kg)   BMI 25.58 kg/m   Wt Readings from Last 3 Encounters:  12/15/16 131 lb (59.4 kg)  12/10/16 132 lb (59.9 kg)  12/03/16 132 lb (59.9 kg)    Physical Exam  Constitutional: She is oriented to person, place, and time. She appears well-developed and well-nourished. No distress.  Eyes: Right conjunctiva is injected (Patient has small amount of redness under the eyelid as well as injected conjunctiva).  Cardiovascular: Normal rate, regular rhythm, normal heart sounds and intact distal pulses.   No murmur heard. Pulmonary/Chest: Effort normal and breath sounds normal. No respiratory distress. She has no wheezes.  Musculoskeletal: Normal range of motion. She exhibits no edema or tenderness.  Neurological: She is alert and oriented to person, place, and time. Coordination normal.  Skin: Skin is warm and dry. Lesion (Small rash in about dime-sized area with erythema and small white filled pustules) noted. No rash noted. She is not diaphoretic.  Psychiatric: She has a normal mood and affect. Her behavior is normal.  Nursing note and vitals reviewed.     Assessment & Plan:   Problem List Items Addressed This Visit    None    Visit Diagnoses    Periorbital cellulitis of left eye    -  Primary   Recommended to go see ophthalmologist, patient are has  1   Relevant Medications   sulfamethoxazole-trimethoprim (BACTRIM DS,SEPTRA DS) 425-956 MG tablet   Folliculitis       Relevant Medications   sulfamethoxazole-trimethoprim (BACTRIM DS,SEPTRA DS) 800-160 MG tablet      Follow up plan: Return if symptoms worsen or fail to improve.  Counseling provided for all of the vaccine components No orders of the defined types were placed in this encounter.   Caryl Pina, MD [facility] 12/15/2016, 11:13 AM

## 2016-12-17 DIAGNOSIS — H10013 Acute follicular conjunctivitis, bilateral: Secondary | ICD-10-CM | POA: Diagnosis not present

## 2016-12-30 ENCOUNTER — Ambulatory Visit (INDEPENDENT_AMBULATORY_CARE_PROVIDER_SITE_OTHER): Payer: Medicare Other | Admitting: Nurse Practitioner

## 2016-12-30 ENCOUNTER — Encounter: Payer: Self-pay | Admitting: Nurse Practitioner

## 2016-12-30 ENCOUNTER — Telehealth: Payer: Self-pay | Admitting: Nurse Practitioner

## 2016-12-30 VITALS — BP 203/89 | HR 77 | Temp 97.4°F | Ht 60.0 in | Wt 131.0 lb

## 2016-12-30 DIAGNOSIS — K642 Third degree hemorrhoids: Secondary | ICD-10-CM

## 2016-12-30 DIAGNOSIS — I2510 Atherosclerotic heart disease of native coronary artery without angina pectoris: Secondary | ICD-10-CM

## 2016-12-30 MED ORDER — LIDOCAINE-HYDROCORTISONE ACE 1-1 % EX CREA: 1.0000 "application " | TOPICAL_CREAM | Freq: Two times a day (BID) | CUTANEOUS | 5 refills | Status: DC

## 2016-12-30 MED ORDER — POLYETHYLENE GLYCOL 3350 17 GM/SCOOP PO POWD: 1.0000 | ORAL | 3 refills | Status: DC | PRN

## 2016-12-30 NOTE — Patient Instructions (Signed)
Hemorrhoids Hemorrhoids are swollen veins in and around the rectum or anus. Hemorrhoids can cause pain, itching, or bleeding. Most of the time, they do not cause serious problems. They usually get better with diet changes, lifestyle changes, and other home treatments. Follow these instructions at home: Eating and drinking   Eat foods that have fiber, such as whole grains, beans, nuts, fruits, and vegetables. Ask your doctor about taking products that have added fiber (fibersupplements).  Drink enough fluid to keep your pee (urine) clear or pale yellow. For Pain and Swelling   Take a warm-water bath (sitz bath) for 20 minutes to ease pain. Do this 3-4 times a day.  If directed, put ice on the painful area. It may be helpful to use ice between your warm baths.  Put ice in a plastic bag.  Place a towel between your skin and the bag.  Leave the ice on for 20 minutes, 2-3 times a day. General instructions   Take over-the-counter and prescription medicines only as told by your doctor.  Medicated creams and medicines that are inserted into the anus (suppositories) may be used or applied as told.  Exercise often.  Go to the bathroom when you have the urge to poop (to have a bowel movement). Do not wait.  Avoid pushing too hard (straining) when you poop.  Keep the butt area dry and clean. Use wet toilet paper or moist paper towels.  Do not sit on the toilet for a long time. Contact a doctor if:  You have any of these:  Pain and swelling that do not get better with treatment or medicine.  Bleeding that will not stop.  Trouble pooping or you cannot poop.  Pain or swelling outside the area of the hemorrhoids. This information is not intended to replace advice given to you by your health care provider. Make sure you discuss any questions you have with your health care provider. Document Released: 05/13/2008 Document Revised: 01/10/2016 Document Reviewed: 04/18/2015 Elsevier  Interactive Patient Education  2017 [employer].  

## 2016-12-30 NOTE — Telephone Encounter (Signed)
Called and spoke with [facility] and told them to fill what ever lidocaine hydrocortisone cream they had

## 2016-12-30 NOTE — Progress Notes (Signed)
   Subjective:    Patient ID: Erin Mckay, female    DOB: 11/15/1923, 83 y.o.   MRN: [id number]  HPI  Patient is brought in y her daughter with constant c/o rectal pain. Has ben having intermittently for several years. Has seen Dr. Livia Snellen and has been given suppositories and hemorrhoid cream which do not help. Daughter says that patient has been screaming for 2 days non stop of rectal pain.   Review of Systems  Constitutional: Negative.   HENT: Negative.   Respiratory: Negative.   Cardiovascular: Negative.   Gastrointestinal: Negative.   Genitourinary: Negative.   Neurological: Negative.   Psychiatric/Behavioral: Negative.   All other systems reviewed and are negative.      Objective:   Physical Exam  Constitutional: She appears well-developed and well-nourished.  Cardiovascular: Normal rate, regular rhythm and normal heart sounds.   Abdominal: Soft. She exhibits no distension. There is no tenderness.  Genitourinary:  Genitourinary Comments: 1 small nonhrombosed external hemorrhoid and one large thrombosed internal hemorrhoid.  Skin: Skin is warm.  Psychiatric: She has a normal mood and affect. Her behavior is normal. Judgment and thought content normal.   BP (!) 203/89   Pulse 77   Temp 97.4 F (36.3 C) (Oral)   Ht 5' (1.524 m)   Wt 131 lb (59.4 kg)   BMI 25.58 kg/m        Assessment & Plan:  1. Grade III hemorrhoids Stool softeners are very important Preparation h wipes - polyethylene glycol powder (MIRALAX) powder; Take 255 g by mouth as needed.  Dispense: 255 g; Refill: 3 - Lidocaine-Hydrocortisone Ace 1-1 % CREA; Apply 1 application topically 2 (two) times daily.  Dispense: 30 g; Refill: 5 - Ambulatory referral to [facility] [place]

## 2016-12-30 NOTE — Telephone Encounter (Signed)
Please advise 

## 2017-01-01 ENCOUNTER — Telehealth: Payer: Self-pay

## 2017-01-01 ENCOUNTER — Other Ambulatory Visit: Payer: Self-pay | Admitting: Nurse Practitioner

## 2017-01-01 DIAGNOSIS — K642 Third degree hemorrhoids: Secondary | ICD-10-CM

## 2017-01-01 MED ORDER — LIDOCAINE 5 % EX OINT: 1.0000 "application " | TOPICAL_OINTMENT | CUTANEOUS | 0 refills | Status: DC | PRN

## 2017-01-01 MED ORDER — POLYETHYLENE GLYCOL 3350 17 GM/SCOOP PO POWD: ORAL | 3 refills | Status: DC

## 2017-01-01 NOTE — Telephone Encounter (Signed)
I sent in lidocaine without cortisone. See if covered. If it is have pt try it & Let me know if it is helpful.

## 2017-01-06 ENCOUNTER — Telehealth: Payer: Self-pay | Admitting: Family Medicine

## 2017-01-06 ENCOUNTER — Other Ambulatory Visit: Payer: Self-pay | Admitting: Family Medicine

## 2017-01-06 MED ORDER — ACETAMINOPHEN-CODEINE #3 300-30 MG PO TABS: 1.0000 | ORAL_TABLET | ORAL | 0 refills | Status: AC | PRN

## 2017-01-06 NOTE — Telephone Encounter (Signed)
Scrip ordered. Will need to pick up.

## 2017-01-07 NOTE — Telephone Encounter (Signed)
Aware written Rx ready for pickup

## 2017-01-10 DIAGNOSIS — F0390 Unspecified dementia without behavioral disturbance: Secondary | ICD-10-CM | POA: Diagnosis not present

## 2017-01-10 DIAGNOSIS — K644 Residual hemorrhoidal skin tags: Secondary | ICD-10-CM | POA: Diagnosis not present

## 2017-01-10 DIAGNOSIS — Z7982 Long term (current) use of aspirin: Secondary | ICD-10-CM | POA: Diagnosis not present

## 2017-01-10 DIAGNOSIS — K6289 Other specified diseases of anus and rectum: Secondary | ICD-10-CM | POA: Diagnosis not present

## 2017-01-10 DIAGNOSIS — F17200 Nicotine dependence, unspecified, uncomplicated: Secondary | ICD-10-CM | POA: Diagnosis not present

## 2017-01-10 DIAGNOSIS — I10 Essential (primary) hypertension: Secondary | ICD-10-CM | POA: Diagnosis not present

## 2017-01-10 DIAGNOSIS — Z79899 Other long term (current) drug therapy: Secondary | ICD-10-CM | POA: Diagnosis not present

## 2017-01-13 DIAGNOSIS — K6289 Other specified diseases of anus and rectum: Secondary | ICD-10-CM | POA: Diagnosis not present

## 2017-01-13 DIAGNOSIS — F17200 Nicotine dependence, unspecified, uncomplicated: Secondary | ICD-10-CM | POA: Diagnosis not present

## 2017-01-13 DIAGNOSIS — K649 Unspecified hemorrhoids: Secondary | ICD-10-CM | POA: Diagnosis not present

## 2017-01-13 DIAGNOSIS — Z881 Allergy status to other antibiotic agents status: Secondary | ICD-10-CM | POA: Diagnosis not present

## 2017-01-21 DIAGNOSIS — I714 Abdominal aortic aneurysm, without rupture: Secondary | ICD-10-CM | POA: Diagnosis not present

## 2017-01-21 DIAGNOSIS — K5900 Constipation, unspecified: Secondary | ICD-10-CM | POA: Diagnosis not present

## 2017-01-21 DIAGNOSIS — F0390 Unspecified dementia without behavioral disturbance: Secondary | ICD-10-CM | POA: Diagnosis not present

## 2017-01-21 DIAGNOSIS — R1084 Generalized abdominal pain: Secondary | ICD-10-CM | POA: Diagnosis not present

## 2017-01-21 DIAGNOSIS — Z79899 Other long term (current) drug therapy: Secondary | ICD-10-CM | POA: Diagnosis not present

## 2017-01-21 DIAGNOSIS — F17200 Nicotine dependence, unspecified, uncomplicated: Secondary | ICD-10-CM | POA: Diagnosis not present

## 2017-02-17 DIAGNOSIS — F17200 Nicotine dependence, unspecified, uncomplicated: Secondary | ICD-10-CM | POA: Diagnosis not present

## 2017-02-17 DIAGNOSIS — F17210 Nicotine dependence, cigarettes, uncomplicated: Secondary | ICD-10-CM | POA: Diagnosis not present

## 2017-02-17 DIAGNOSIS — K644 Residual hemorrhoidal skin tags: Secondary | ICD-10-CM | POA: Diagnosis not present

## 2017-02-17 DIAGNOSIS — Z8544 Personal history of malignant neoplasm of other female genital organs: Secondary | ICD-10-CM | POA: Diagnosis not present

## 2017-02-17 DIAGNOSIS — K649 Unspecified hemorrhoids: Secondary | ICD-10-CM | POA: Diagnosis not present

## 2017-02-24 DIAGNOSIS — H2511 Age-related nuclear cataract, right eye: Secondary | ICD-10-CM | POA: Diagnosis not present

## 2017-02-24 DIAGNOSIS — H0015 Chalazion left lower eyelid: Secondary | ICD-10-CM | POA: Diagnosis not present

## 2017-02-24 DIAGNOSIS — Z961 Presence of intraocular lens: Secondary | ICD-10-CM | POA: Diagnosis not present

## 2017-03-11 ENCOUNTER — Encounter: Payer: Self-pay | Admitting: Family Medicine

## 2017-03-11 ENCOUNTER — Encounter (INDEPENDENT_AMBULATORY_CARE_PROVIDER_SITE_OTHER): Payer: Self-pay

## 2017-03-11 ENCOUNTER — Ambulatory Visit (INDEPENDENT_AMBULATORY_CARE_PROVIDER_SITE_OTHER): Payer: Medicare Other | Admitting: Family Medicine

## 2017-03-11 VITALS — BP 169/90 | HR 83 | Temp 98.6°F | Ht 60.0 in | Wt 130.0 lb

## 2017-03-11 DIAGNOSIS — G5602 Carpal tunnel syndrome, left upper limb: Secondary | ICD-10-CM

## 2017-03-11 DIAGNOSIS — I2510 Atherosclerotic heart disease of native coronary artery without angina pectoris: Secondary | ICD-10-CM

## 2017-03-11 MED ORDER — PREDNISONE 20 MG PO TABS: ORAL_TABLET | ORAL | 0 refills | Status: DC

## 2017-03-11 NOTE — Progress Notes (Signed)
   BP (!) 177/94   Pulse 83   Temp 98.6 F (37 C) (Oral)   Ht 5' (1.524 m)   Wt 130 lb (59 kg)   BMI 25.39 kg/m    Subjective:    Patient ID: Erin Mckay, female    DOB: March 16, 1924, 83 y.o.   MRN: [id number]  HPI: Erin Mckay is a 83 y.o. female presenting on 03/11/2017 for Numbness and pain in left hand and discuss dulcolax   HPI Tingling numbness and burning Patient comes in with complaints of tingling numbness and burning in the fingertips on her left hand mostly in the thumb and the first 2 fingers. She denies any pain with range of motion or loss of grip strength. She just mainly has the burning tingling sensation in the tips of her fingers. She denies any skin color changes or feeling cold. She denies any trauma. She has been using Tylenol which did not seem to help.  Relevant past medical, surgical, family and social history reviewed and updated as indicated. Interim medical history since our last visit reviewed. Allergies and medications reviewed and updated.  Review of Systems  Constitutional: Negative for chills and fever.  Respiratory: Negative for chest tightness and shortness of breath.   Cardiovascular: Negative for chest pain and leg swelling.  Skin: Negative for color change and rash.  Neurological: Positive for numbness. Negative for light-headedness and headaches.  Psychiatric/Behavioral: Negative for agitation and behavioral problems.  All other systems reviewed and are negative.   Per HPI unless specifically indicated above        Objective:    BP (!) 177/94   Pulse 83   Temp 98.6 F (37 C) (Oral)   Ht 5' (1.524 m)   Wt 130 lb (59 kg)   BMI 25.39 kg/m   Wt Readings from Last 3 Encounters:  03/11/17 130 lb (59 kg)  12/30/16 131 lb (59.4 kg)  12/15/16 131 lb (59.4 kg)    Physical Exam  Constitutional: She is oriented to person, place, and time. She appears well-developed and well-nourished. No distress.  Eyes: Conjunctivae are  normal.  Cardiovascular: Normal rate, regular rhythm, normal heart sounds and intact distal pulses.   No murmur heard. Pulmonary/Chest: Effort normal and breath sounds normal. No respiratory distress. She has no wheezes.  Musculoskeletal: Normal range of motion. She exhibits no edema or tenderness.  Neurological: She is alert and oriented to person, place, and time. A sensory deficit (Numbness and tingling in the fingertips on her left hand. More in second and third digit fingertip) is present. Coordination normal.  Skin: Skin is warm and dry. No rash noted. She is not diaphoretic.  Psychiatric: She has a normal mood and affect. Her behavior is normal.  Nursing note and vitals reviewed.     Assessment & Plan:   Problem List Items Addressed This Visit    None    Visit Diagnoses    Carpal tunnel syndrome of left wrist    -  Primary   Relevant Medications   LORazepam (ATIVAN) 1 MG tablet   predniSONE (DELTASONE) 20 MG tablet       Follow up plan: Return if symptoms worsen or fail to improve.  Counseling provided for all of the vaccine components No orders of the defined types were placed in this encounter.   Caryl Pina, MD [facility] 03/11/2017, 3:51 PM

## 2017-03-13 ENCOUNTER — Ambulatory Visit: Payer: Medicare Other | Admitting: Physician Assistant

## 2017-03-16 ENCOUNTER — Telehealth: Payer: Self-pay | Admitting: Family Medicine

## 2017-03-16 NOTE — Telephone Encounter (Signed)
Informed pt's daughter we can fill out FL-2 if the family finds a facility for the pt

## 2017-03-20 ENCOUNTER — Other Ambulatory Visit: Payer: Self-pay | Admitting: Pediatrics

## 2017-03-20 DIAGNOSIS — K640 First degree hemorrhoids: Secondary | ICD-10-CM

## 2017-03-25 NOTE — Progress Notes (Signed)
REVIEWED-NO ADDITIONAL RECOMMENDATIONS. 

## 2017-03-31 ENCOUNTER — Encounter: Payer: Self-pay | Admitting: Family Medicine

## 2017-03-31 ENCOUNTER — Ambulatory Visit (INDEPENDENT_AMBULATORY_CARE_PROVIDER_SITE_OTHER): Payer: Medicare Other | Admitting: Family Medicine

## 2017-03-31 VITALS — BP 163/80 | HR 87 | Ht 60.0 in | Wt 131.0 lb

## 2017-03-31 DIAGNOSIS — I2510 Atherosclerotic heart disease of native coronary artery without angina pectoris: Secondary | ICD-10-CM

## 2017-03-31 DIAGNOSIS — I10 Essential (primary) hypertension: Secondary | ICD-10-CM

## 2017-03-31 DIAGNOSIS — K6289 Other specified diseases of anus and rectum: Secondary | ICD-10-CM | POA: Diagnosis not present

## 2017-03-31 DIAGNOSIS — F0391 Unspecified dementia with behavioral disturbance: Secondary | ICD-10-CM | POA: Diagnosis not present

## 2017-03-31 DIAGNOSIS — F03918 Unspecified dementia, unspecified severity, with other behavioral disturbance: Secondary | ICD-10-CM | POA: Insufficient documentation

## 2017-03-31 MED ORDER — QUETIAPINE FUMARATE 50 MG PO TABS: 50.0000 mg | ORAL_TABLET | Freq: Every day | ORAL | 0 refills | Status: DC
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
## 2017-04-14 ENCOUNTER — Encounter: Payer: Self-pay | Admitting: Family Medicine

## 2017-04-14 ENCOUNTER — Ambulatory Visit (INDEPENDENT_AMBULATORY_CARE_PROVIDER_SITE_OTHER): Payer: Medicare Other | Admitting: Family Medicine

## 2017-04-14 VITALS — BP 159/78 | HR 81 | Temp 97.2°F | Ht 60.0 in | Wt 132.0 lb

## 2017-04-14 DIAGNOSIS — I2510 Atherosclerotic heart disease of native coronary artery without angina pectoris: Secondary | ICD-10-CM | POA: Diagnosis not present

## 2017-04-14 DIAGNOSIS — N94819 Vulvodynia, unspecified: Secondary | ICD-10-CM | POA: Insufficient documentation

## 2017-04-14 DIAGNOSIS — F0391 Unspecified dementia with behavioral disturbance: Secondary | ICD-10-CM

## 2017-04-14 MED ORDER — QUETIAPINE FUMARATE 100 MG PO TABS: 100.0000 mg | ORAL_TABLET | Freq: Every day | ORAL | 2 refills | Status: AC

## 2017-04-14 NOTE — Progress Notes (Addendum)
Subjective:  Patient ID: Erin Mckay, female    DOB: 1924-05-19  Age: 83 y.o. MRN: [id number]  CC: Follow-up (pt here today following up and she is trying to get into [facility] )   HPI Erin Mckay presents for Patient has actually become more combative. Therefore the daughter, Erin Mckay, is she has no choice but to enter her into a nursing facility. They are trying to arrange for her to go into [facility] at the end of this week. Erin Mckay believes that really it is causing her to have her pain. The pain is in the genitalia from previous cancer. Patient has had increasing signs of dementia that his memory loss and hostility for several months. That seems to have crescendoed over the last several weeks. The patient tells me today that they have occasional arguments but overall they get along just fine. The daughter who comes in today, Erin Mckay, is here because Erin Mckay refused to get into the car with Erin Mckay for [place] of bringing her here.  Depression screen [facility] 2/9 04/14/2017 03/11/2017 12/30/2016  Decreased Interest 0 0 0  Down, Depressed, Hopeless 0 0 0  PHQ - 2 Score 0 0 0    History Erin Mckay has a past medical history of Cataract; Complication of anesthesia; Dementia; Frequent UTI; Hard of hearing; Hemorrhoids; Hypertension; Measles; Vision loss, bilateral; Vulvar cancer ([place]) (02/09/14); and Vulvar cancer ([place]) (03/28/14).   She has a past surgical history that includes Tubal ligation; Appendectomy; Eye surgery (Left); Cataract extraction (Left); Vulvectomy (Right, 03/28/2014); Flexible sigmoidoscopy (N/A, 08/08/2014); Hemorrhoid banding (N/A, 08/08/2014); and Flexible sigmoidoscopy (N/A, 08/22/2014).   Her family history includes Anuerysm in her Mckay; Cancer in her brother and son; Early death in her father; Heart disease in her brother and mother; Hyperlipidemia in her daughter; Hypertension in her daughter, daughter, son, and son.She reports that she has been smoking  Cigarettes.  She has a 72.00 pack-year smoking history. She has quit using smokeless tobacco. Her smokeless tobacco use included Chew. She reports that she does not drink alcohol or use drugs.    ROS Review of Systems  Unable to perform ROS: Dementia    Objective:  BP (!) 159/78   Pulse 81   Temp (!) 97.2 F (36.2 C) (Oral)   Ht 5' (1.524 m)   Wt 132 lb (59.9 kg)   BMI 25.78 kg/m   BP Readings from Last 3 Encounters:  04/14/17 (!) 159/78  03/31/17 (!) 163/80  03/11/17 (!) 169/90    Wt Readings from Last 3 Encounters:  04/14/17 132 lb (59.9 kg)  03/31/17 131 lb (59.4 kg)  03/11/17 130 lb (59 kg)     Physical Exam  Constitutional: She is oriented to person, place, and time. She appears well-developed and well-nourished. No distress.  HENT:  Head: Normocephalic and atraumatic.  Eyes: Pupils are equal, round, and reactive to light. Conjunctivae are normal.  Neck: Normal range of motion. Neck supple. No thyromegaly present.  Cardiovascular: Normal rate, regular rhythm and normal heart sounds.   No murmur heard. Pulmonary/Chest: Effort normal and breath sounds normal. No respiratory distress. She has no wheezes. She has no rales.  Abdominal: Soft. She exhibits no distension. There is no tenderness.  Musculoskeletal: Normal range of motion.  Lymphadenopathy:    She has no cervical adenopathy.  Neurological: She is alert and oriented to person, place, and time.  Skin: Skin is warm and dry.  Psychiatric: Her mood appears anxious. Her speech is delayed  and slurred. She is slowed. Thought content is paranoid and delusional. Cognition and memory are impaired. She expresses impulsivity and inappropriate judgment. She exhibits abnormal recent memory. She is inattentive.      Assessment & Plan:   Erin Mckay was seen today for follow-up.  Diagnoses and all orders for this visit:  Dementia with behavioral disturbance, unspecified dementia type  Vulvodynia  Other orders -      QUEtiapine (SEROQUEL) 100 MG tablet; Take 1 tablet (100 mg total) by mouth at bedtime.       I have discontinued Erin Mckay's QUEtiapine. I am also having her start on QUEtiapine. Additionally, I am having her maintain her amLODipine, aspirin EC, acetaminophen, diphenhydrAMINE, acetaminophen-codeine, bisacodyl, LORazepam, hydrocortisone, mesalamine, simethicone, and PROCTO-MED HC.  Allergies as of 04/14/2017      Reactions   Pineapple Swelling   Streptomycin    Any mycin drugs, causes numbness all over body   Macrolides And Ketolides       Medication List       Accurate as of 04/14/17  3:53 PM. Always use your most recent med list.          acetaminophen 500 MG tablet Commonly known as:  TYLENOL Take 1,000 mg by mouth as needed.   acetaminophen-codeine 300-30 MG tablet Commonly known as:  TYLENOL #3 Take 1-2 tablets by mouth every 4 (four) hours as needed for moderate pain.   amLODipine 10 MG tablet Commonly known as:  NORVASC Take 5 mg by mouth daily.   aspirin EC 81 MG tablet Take 81 mg by mouth daily.   bisacodyl 5 MG EC tablet Commonly known as:  DULCOLAX Take 5 mg by mouth daily as needed for moderate constipation.   diphenhydrAMINE 25 MG tablet Commonly known as:  BENADRYL Take 25 mg by mouth as needed.   hydrocortisone 2.5 % cream Apply 2.5 application topically 2 (two) times daily.   LORazepam 1 MG tablet Commonly known as:  ATIVAN Take 1 mg by mouth every 8 (eight) hours as needed for anxiety.   mesalamine 1000 MG suppository Commonly known as:  CANASA Place 1,000 mg rectally at bedtime.   PROCTO-MED HC 2.5 % rectal cream Generic drug:  hydrocortisone APPLY 1 APPLICATION RECTALLY TWICE DAILY   QUEtiapine 100 MG tablet Commonly known as:  SEROQUEL Take 1 tablet (100 mg total) by mouth at bedtime.   simethicone 125 MG chewable tablet Commonly known as:  MYLICON Chew 812 mg by mouth every 6 (six) hours as needed for flatulence.             Discharge Care Instructions        Start     Ordered   04/14/17 0000  QUEtiapine (SEROQUEL) 100 MG tablet  Daily at bedtime     04/14/17 1351     Quetiapine increased to 100 mg at bedtime to help with the behavioral issues. FL2 completed for placement in [facility]. Follow-up: Return in about 2 weeks (around 04/28/2017).  Claretta Fraise, M.D.

## 2017-04-18 DIAGNOSIS — F0391 Unspecified dementia with behavioral disturbance: Secondary | ICD-10-CM | POA: Diagnosis not present

## 2017-04-18 DIAGNOSIS — R41841 Cognitive communication deficit: Secondary | ICD-10-CM | POA: Diagnosis not present

## 2017-04-19 DIAGNOSIS — F0391 Unspecified dementia with behavioral disturbance: Secondary | ICD-10-CM | POA: Diagnosis not present

## 2017-04-19 DIAGNOSIS — R41841 Cognitive communication deficit: Secondary | ICD-10-CM | POA: Diagnosis not present

## 2017-04-20 DIAGNOSIS — F0391 Unspecified dementia with behavioral disturbance: Secondary | ICD-10-CM | POA: Diagnosis not present

## 2017-04-20 DIAGNOSIS — R41841 Cognitive communication deficit: Secondary | ICD-10-CM | POA: Diagnosis not present

## 2017-04-21 DIAGNOSIS — E119 Type 2 diabetes mellitus without complications: Secondary | ICD-10-CM | POA: Diagnosis not present

## 2017-04-21 DIAGNOSIS — D689 Coagulation defect, unspecified: Secondary | ICD-10-CM | POA: Diagnosis not present

## 2017-04-21 DIAGNOSIS — E782 Mixed hyperlipidemia: Secondary | ICD-10-CM | POA: Diagnosis not present

## 2017-04-21 DIAGNOSIS — D649 Anemia, unspecified: Secondary | ICD-10-CM | POA: Diagnosis not present

## 2017-04-21 DIAGNOSIS — E039 Hypothyroidism, unspecified: Secondary | ICD-10-CM | POA: Diagnosis not present

## 2017-04-21 DIAGNOSIS — R41841 Cognitive communication deficit: Secondary | ICD-10-CM | POA: Diagnosis not present

## 2017-04-21 DIAGNOSIS — D518 Other vitamin B12 deficiency anemias: Secondary | ICD-10-CM | POA: Diagnosis not present

## 2017-04-21 DIAGNOSIS — Z79899 Other long term (current) drug therapy: Secondary | ICD-10-CM | POA: Diagnosis not present

## 2017-04-21 DIAGNOSIS — F0391 Unspecified dementia with behavioral disturbance: Secondary | ICD-10-CM | POA: Diagnosis not present

## 2017-04-22 DIAGNOSIS — R41841 Cognitive communication deficit: Secondary | ICD-10-CM | POA: Diagnosis not present

## 2017-04-22 DIAGNOSIS — F0391 Unspecified dementia with behavioral disturbance: Secondary | ICD-10-CM | POA: Diagnosis not present

## 2017-04-24 DIAGNOSIS — R41841 Cognitive communication deficit: Secondary | ICD-10-CM | POA: Diagnosis not present

## 2017-04-24 DIAGNOSIS — F0391 Unspecified dementia with behavioral disturbance: Secondary | ICD-10-CM | POA: Diagnosis not present

## 2017-04-25 DIAGNOSIS — F0391 Unspecified dementia with behavioral disturbance: Secondary | ICD-10-CM | POA: Diagnosis not present

## 2017-04-25 DIAGNOSIS — R41841 Cognitive communication deficit: Secondary | ICD-10-CM | POA: Diagnosis not present

## 2017-04-27 DIAGNOSIS — I10 Essential (primary) hypertension: Secondary | ICD-10-CM | POA: Diagnosis not present

## 2017-04-27 DIAGNOSIS — K5900 Constipation, unspecified: Secondary | ICD-10-CM | POA: Diagnosis not present

## 2017-04-27 DIAGNOSIS — K649 Unspecified hemorrhoids: Secondary | ICD-10-CM | POA: Diagnosis not present

## 2017-04-27 DIAGNOSIS — F0391 Unspecified dementia with behavioral disturbance: Secondary | ICD-10-CM | POA: Diagnosis not present

## 2017-04-27 DIAGNOSIS — R41841 Cognitive communication deficit: Secondary | ICD-10-CM | POA: Diagnosis not present

## 2017-04-29 ENCOUNTER — Ambulatory Visit: Payer: Medicare Other | Admitting: Family Medicine

## 2017-04-29 DIAGNOSIS — R41841 Cognitive communication deficit: Secondary | ICD-10-CM | POA: Diagnosis not present

## 2017-04-29 DIAGNOSIS — F0391 Unspecified dementia with behavioral disturbance: Secondary | ICD-10-CM | POA: Diagnosis not present

## 2017-04-30 DIAGNOSIS — R41841 Cognitive communication deficit: Secondary | ICD-10-CM | POA: Diagnosis not present

## 2017-04-30 DIAGNOSIS — F0391 Unspecified dementia with behavioral disturbance: Secondary | ICD-10-CM | POA: Diagnosis not present

## 2017-05-01 DIAGNOSIS — R41841 Cognitive communication deficit: Secondary | ICD-10-CM | POA: Diagnosis not present

## 2017-05-01 DIAGNOSIS — F0391 Unspecified dementia with behavioral disturbance: Secondary | ICD-10-CM | POA: Diagnosis not present

## 2017-05-02 DIAGNOSIS — R41841 Cognitive communication deficit: Secondary | ICD-10-CM | POA: Diagnosis not present

## 2017-05-02 DIAGNOSIS — F0391 Unspecified dementia with behavioral disturbance: Secondary | ICD-10-CM | POA: Diagnosis not present

## 2017-05-03 DIAGNOSIS — R41841 Cognitive communication deficit: Secondary | ICD-10-CM | POA: Diagnosis not present

## 2017-05-03 DIAGNOSIS — I10 Essential (primary) hypertension: Secondary | ICD-10-CM | POA: Diagnosis not present

## 2017-05-03 DIAGNOSIS — K5900 Constipation, unspecified: Secondary | ICD-10-CM | POA: Diagnosis not present

## 2017-05-03 DIAGNOSIS — K649 Unspecified hemorrhoids: Secondary | ICD-10-CM | POA: Diagnosis not present

## 2017-05-03 DIAGNOSIS — F0391 Unspecified dementia with behavioral disturbance: Secondary | ICD-10-CM | POA: Diagnosis not present

## 2017-05-04 DIAGNOSIS — R41841 Cognitive communication deficit: Secondary | ICD-10-CM | POA: Diagnosis not present

## 2017-05-04 DIAGNOSIS — F0391 Unspecified dementia with behavioral disturbance: Secondary | ICD-10-CM | POA: Diagnosis not present

## 2017-05-05 DIAGNOSIS — F0391 Unspecified dementia with behavioral disturbance: Secondary | ICD-10-CM | POA: Diagnosis not present

## 2017-05-05 DIAGNOSIS — R41841 Cognitive communication deficit: Secondary | ICD-10-CM | POA: Diagnosis not present

## 2017-05-06 DIAGNOSIS — R41841 Cognitive communication deficit: Secondary | ICD-10-CM | POA: Diagnosis not present

## 2017-05-06 DIAGNOSIS — F0391 Unspecified dementia with behavioral disturbance: Secondary | ICD-10-CM | POA: Diagnosis not present

## 2017-05-07 DIAGNOSIS — R41841 Cognitive communication deficit: Secondary | ICD-10-CM | POA: Diagnosis not present

## 2017-05-07 DIAGNOSIS — F0391 Unspecified dementia with behavioral disturbance: Secondary | ICD-10-CM | POA: Diagnosis not present

## 2017-05-08 DIAGNOSIS — R41841 Cognitive communication deficit: Secondary | ICD-10-CM | POA: Diagnosis not present

## 2017-05-08 DIAGNOSIS — F0391 Unspecified dementia with behavioral disturbance: Secondary | ICD-10-CM | POA: Diagnosis not present

## 2017-05-09 DIAGNOSIS — F0391 Unspecified dementia with behavioral disturbance: Secondary | ICD-10-CM | POA: Diagnosis not present

## 2017-05-09 DIAGNOSIS — R41841 Cognitive communication deficit: Secondary | ICD-10-CM | POA: Diagnosis not present

## 2017-05-11 DIAGNOSIS — R41841 Cognitive communication deficit: Secondary | ICD-10-CM | POA: Diagnosis not present

## 2017-05-11 DIAGNOSIS — F0391 Unspecified dementia with behavioral disturbance: Secondary | ICD-10-CM | POA: Diagnosis not present

## 2017-05-12 DIAGNOSIS — F0391 Unspecified dementia with behavioral disturbance: Secondary | ICD-10-CM | POA: Diagnosis not present

## 2017-05-12 DIAGNOSIS — R41841 Cognitive communication deficit: Secondary | ICD-10-CM | POA: Diagnosis not present

## 2017-05-14 DIAGNOSIS — F0391 Unspecified dementia with behavioral disturbance: Secondary | ICD-10-CM | POA: Diagnosis not present

## 2017-05-14 DIAGNOSIS — R41841 Cognitive communication deficit: Secondary | ICD-10-CM | POA: Diagnosis not present

## 2017-05-15 DIAGNOSIS — R41841 Cognitive communication deficit: Secondary | ICD-10-CM | POA: Diagnosis not present

## 2017-05-15 DIAGNOSIS — F0391 Unspecified dementia with behavioral disturbance: Secondary | ICD-10-CM | POA: Diagnosis not present

## 2017-05-18 DIAGNOSIS — R41841 Cognitive communication deficit: Secondary | ICD-10-CM | POA: Diagnosis not present

## 2017-05-18 DIAGNOSIS — R296 Repeated falls: Secondary | ICD-10-CM | POA: Diagnosis not present

## 2017-05-18 DIAGNOSIS — F0391 Unspecified dementia with behavioral disturbance: Secondary | ICD-10-CM | POA: Diagnosis not present

## 2017-05-19 DIAGNOSIS — R296 Repeated falls: Secondary | ICD-10-CM | POA: Diagnosis not present

## 2017-05-19 DIAGNOSIS — R41841 Cognitive communication deficit: Secondary | ICD-10-CM | POA: Diagnosis not present

## 2017-05-19 DIAGNOSIS — F0391 Unspecified dementia with behavioral disturbance: Secondary | ICD-10-CM | POA: Diagnosis not present

## 2017-05-20 DIAGNOSIS — F0391 Unspecified dementia with behavioral disturbance: Secondary | ICD-10-CM | POA: Diagnosis not present

## 2017-05-20 DIAGNOSIS — R41841 Cognitive communication deficit: Secondary | ICD-10-CM | POA: Diagnosis not present

## 2017-05-20 DIAGNOSIS — R296 Repeated falls: Secondary | ICD-10-CM | POA: Diagnosis not present

## 2017-05-22 DIAGNOSIS — R296 Repeated falls: Secondary | ICD-10-CM | POA: Diagnosis not present

## 2017-05-22 DIAGNOSIS — F0391 Unspecified dementia with behavioral disturbance: Secondary | ICD-10-CM | POA: Diagnosis not present

## 2017-05-22 DIAGNOSIS — R41841 Cognitive communication deficit: Secondary | ICD-10-CM | POA: Diagnosis not present

## 2017-05-26 DIAGNOSIS — R296 Repeated falls: Secondary | ICD-10-CM | POA: Diagnosis not present

## 2017-05-26 DIAGNOSIS — F0391 Unspecified dementia with behavioral disturbance: Secondary | ICD-10-CM | POA: Diagnosis not present

## 2017-05-26 DIAGNOSIS — R41841 Cognitive communication deficit: Secondary | ICD-10-CM | POA: Diagnosis not present

## 2017-05-27 DIAGNOSIS — R41841 Cognitive communication deficit: Secondary | ICD-10-CM | POA: Diagnosis not present

## 2017-05-27 DIAGNOSIS — F0391 Unspecified dementia with behavioral disturbance: Secondary | ICD-10-CM | POA: Diagnosis not present

## 2017-05-27 DIAGNOSIS — R296 Repeated falls: Secondary | ICD-10-CM | POA: Diagnosis not present

## 2017-05-28 DIAGNOSIS — R296 Repeated falls: Secondary | ICD-10-CM | POA: Diagnosis not present

## 2017-05-28 DIAGNOSIS — R41841 Cognitive communication deficit: Secondary | ICD-10-CM | POA: Diagnosis not present

## 2017-05-28 DIAGNOSIS — F0391 Unspecified dementia with behavioral disturbance: Secondary | ICD-10-CM | POA: Diagnosis not present

## 2017-05-29 DIAGNOSIS — R41841 Cognitive communication deficit: Secondary | ICD-10-CM | POA: Diagnosis not present

## 2017-05-29 DIAGNOSIS — R296 Repeated falls: Secondary | ICD-10-CM | POA: Diagnosis not present

## 2017-05-29 DIAGNOSIS — F0391 Unspecified dementia with behavioral disturbance: Secondary | ICD-10-CM | POA: Diagnosis not present

## 2017-05-30 DIAGNOSIS — F0391 Unspecified dementia with behavioral disturbance: Secondary | ICD-10-CM | POA: Diagnosis not present

## 2017-05-30 DIAGNOSIS — R41841 Cognitive communication deficit: Secondary | ICD-10-CM | POA: Diagnosis not present

## 2017-05-30 DIAGNOSIS — R296 Repeated falls: Secondary | ICD-10-CM | POA: Diagnosis not present

## 2017-06-01 DIAGNOSIS — F0391 Unspecified dementia with behavioral disturbance: Secondary | ICD-10-CM | POA: Diagnosis not present

## 2017-06-01 DIAGNOSIS — R41841 Cognitive communication deficit: Secondary | ICD-10-CM | POA: Diagnosis not present

## 2017-06-01 DIAGNOSIS — R296 Repeated falls: Secondary | ICD-10-CM | POA: Diagnosis not present

## 2017-06-02 DIAGNOSIS — R41841 Cognitive communication deficit: Secondary | ICD-10-CM | POA: Diagnosis not present

## 2017-06-02 DIAGNOSIS — Z23 Encounter for immunization: Secondary | ICD-10-CM | POA: Diagnosis not present

## 2017-06-02 DIAGNOSIS — F0391 Unspecified dementia with behavioral disturbance: Secondary | ICD-10-CM | POA: Diagnosis not present

## 2017-06-02 DIAGNOSIS — R296 Repeated falls: Secondary | ICD-10-CM | POA: Diagnosis not present

## 2017-06-03 DIAGNOSIS — F0391 Unspecified dementia with behavioral disturbance: Secondary | ICD-10-CM | POA: Diagnosis not present

## 2017-06-03 DIAGNOSIS — R41841 Cognitive communication deficit: Secondary | ICD-10-CM | POA: Diagnosis not present

## 2017-06-03 DIAGNOSIS — R296 Repeated falls: Secondary | ICD-10-CM | POA: Diagnosis not present

## 2017-06-04 DIAGNOSIS — R296 Repeated falls: Secondary | ICD-10-CM | POA: Diagnosis not present

## 2017-06-04 DIAGNOSIS — K649 Unspecified hemorrhoids: Secondary | ICD-10-CM | POA: Diagnosis not present

## 2017-06-04 DIAGNOSIS — F0391 Unspecified dementia with behavioral disturbance: Secondary | ICD-10-CM | POA: Diagnosis not present

## 2017-06-04 DIAGNOSIS — I10 Essential (primary) hypertension: Secondary | ICD-10-CM | POA: Diagnosis not present

## 2017-06-04 DIAGNOSIS — R41841 Cognitive communication deficit: Secondary | ICD-10-CM | POA: Diagnosis not present

## 2017-06-04 DIAGNOSIS — K5900 Constipation, unspecified: Secondary | ICD-10-CM | POA: Diagnosis not present

## 2017-06-05 DIAGNOSIS — R296 Repeated falls: Secondary | ICD-10-CM | POA: Diagnosis not present

## 2017-06-05 DIAGNOSIS — F0391 Unspecified dementia with behavioral disturbance: Secondary | ICD-10-CM | POA: Diagnosis not present

## 2017-06-05 DIAGNOSIS — R41841 Cognitive communication deficit: Secondary | ICD-10-CM | POA: Diagnosis not present

## 2017-06-08 DIAGNOSIS — R41841 Cognitive communication deficit: Secondary | ICD-10-CM | POA: Diagnosis not present

## 2017-06-08 DIAGNOSIS — R296 Repeated falls: Secondary | ICD-10-CM | POA: Diagnosis not present

## 2017-06-08 DIAGNOSIS — F0391 Unspecified dementia with behavioral disturbance: Secondary | ICD-10-CM | POA: Diagnosis not present

## 2017-06-09 DIAGNOSIS — R296 Repeated falls: Secondary | ICD-10-CM | POA: Diagnosis not present

## 2017-06-09 DIAGNOSIS — R41841 Cognitive communication deficit: Secondary | ICD-10-CM | POA: Diagnosis not present

## 2017-06-09 DIAGNOSIS — F0391 Unspecified dementia with behavioral disturbance: Secondary | ICD-10-CM | POA: Diagnosis not present

## 2017-06-10 DIAGNOSIS — F0391 Unspecified dementia with behavioral disturbance: Secondary | ICD-10-CM | POA: Diagnosis not present

## 2017-06-10 DIAGNOSIS — R296 Repeated falls: Secondary | ICD-10-CM | POA: Diagnosis not present

## 2017-06-10 DIAGNOSIS — R41841 Cognitive communication deficit: Secondary | ICD-10-CM | POA: Diagnosis not present

## 2017-06-11 DIAGNOSIS — R41841 Cognitive communication deficit: Secondary | ICD-10-CM | POA: Diagnosis not present

## 2017-06-11 DIAGNOSIS — R296 Repeated falls: Secondary | ICD-10-CM | POA: Diagnosis not present

## 2017-06-11 DIAGNOSIS — F0391 Unspecified dementia with behavioral disturbance: Secondary | ICD-10-CM | POA: Diagnosis not present

## 2017-06-12 DIAGNOSIS — R41841 Cognitive communication deficit: Secondary | ICD-10-CM | POA: Diagnosis not present

## 2017-06-12 DIAGNOSIS — F0391 Unspecified dementia with behavioral disturbance: Secondary | ICD-10-CM | POA: Diagnosis not present

## 2017-06-12 DIAGNOSIS — R296 Repeated falls: Secondary | ICD-10-CM | POA: Diagnosis not present

## 2017-06-15 DIAGNOSIS — F0391 Unspecified dementia with behavioral disturbance: Secondary | ICD-10-CM | POA: Diagnosis not present

## 2017-06-15 DIAGNOSIS — R296 Repeated falls: Secondary | ICD-10-CM | POA: Diagnosis not present

## 2017-06-15 DIAGNOSIS — R41841 Cognitive communication deficit: Secondary | ICD-10-CM | POA: Diagnosis not present

## 2017-06-16 DIAGNOSIS — F0391 Unspecified dementia with behavioral disturbance: Secondary | ICD-10-CM | POA: Diagnosis not present

## 2017-06-16 DIAGNOSIS — K649 Unspecified hemorrhoids: Secondary | ICD-10-CM | POA: Diagnosis not present

## 2017-06-16 DIAGNOSIS — I10 Essential (primary) hypertension: Secondary | ICD-10-CM | POA: Diagnosis not present

## 2017-06-16 DIAGNOSIS — K5900 Constipation, unspecified: Secondary | ICD-10-CM | POA: Diagnosis not present

## 2017-06-17 DIAGNOSIS — F0391 Unspecified dementia with behavioral disturbance: Secondary | ICD-10-CM | POA: Diagnosis not present

## 2017-06-17 DIAGNOSIS — R41841 Cognitive communication deficit: Secondary | ICD-10-CM | POA: Diagnosis not present

## 2017-06-17 DIAGNOSIS — R296 Repeated falls: Secondary | ICD-10-CM | POA: Diagnosis not present

## 2017-06-19 DIAGNOSIS — F0391 Unspecified dementia with behavioral disturbance: Secondary | ICD-10-CM | POA: Diagnosis not present

## 2017-06-22 DIAGNOSIS — F0391 Unspecified dementia with behavioral disturbance: Secondary | ICD-10-CM | POA: Diagnosis not present

## 2017-06-23 DIAGNOSIS — D649 Anemia, unspecified: Secondary | ICD-10-CM | POA: Diagnosis not present

## 2017-06-23 DIAGNOSIS — F0391 Unspecified dementia with behavioral disturbance: Secondary | ICD-10-CM | POA: Diagnosis not present

## 2017-06-23 DIAGNOSIS — K649 Unspecified hemorrhoids: Secondary | ICD-10-CM | POA: Diagnosis not present

## 2017-06-23 DIAGNOSIS — C519 Malignant neoplasm of vulva, unspecified: Secondary | ICD-10-CM | POA: Diagnosis not present

## 2017-06-23 DIAGNOSIS — G6282 Radiation-induced polyneuropathy: Secondary | ICD-10-CM | POA: Diagnosis not present

## 2017-07-03 DIAGNOSIS — K649 Unspecified hemorrhoids: Secondary | ICD-10-CM | POA: Diagnosis not present

## 2017-07-03 DIAGNOSIS — I10 Essential (primary) hypertension: Secondary | ICD-10-CM | POA: Diagnosis not present

## 2017-07-03 DIAGNOSIS — F0391 Unspecified dementia with behavioral disturbance: Secondary | ICD-10-CM | POA: Diagnosis not present

## 2017-07-03 DIAGNOSIS — K5900 Constipation, unspecified: Secondary | ICD-10-CM | POA: Diagnosis not present

## 2017-07-16 DIAGNOSIS — F0391 Unspecified dementia with behavioral disturbance: Secondary | ICD-10-CM | POA: Diagnosis not present

## 2017-07-16 DIAGNOSIS — G6282 Radiation-induced polyneuropathy: Secondary | ICD-10-CM | POA: Diagnosis not present

## 2017-07-16 DIAGNOSIS — M6281 Muscle weakness (generalized): Secondary | ICD-10-CM | POA: Diagnosis not present

## 2017-07-20 DIAGNOSIS — F0391 Unspecified dementia with behavioral disturbance: Secondary | ICD-10-CM | POA: Diagnosis not present

## 2017-07-20 DIAGNOSIS — F338 Other recurrent depressive disorders: Secondary | ICD-10-CM | POA: Diagnosis not present

## 2017-07-20 DIAGNOSIS — F419 Anxiety disorder, unspecified: Secondary | ICD-10-CM | POA: Diagnosis not present

## 2017-08-06 DIAGNOSIS — R5383 Other fatigue: Secondary | ICD-10-CM | POA: Diagnosis not present

## 2017-08-06 DIAGNOSIS — G6282 Radiation-induced polyneuropathy: Secondary | ICD-10-CM | POA: Diagnosis not present

## 2017-08-06 DIAGNOSIS — F0391 Unspecified dementia with behavioral disturbance: Secondary | ICD-10-CM | POA: Diagnosis not present

## 2017-08-14 DIAGNOSIS — K5900 Constipation, unspecified: Secondary | ICD-10-CM | POA: Diagnosis not present

## 2017-08-15 DIAGNOSIS — F0391 Unspecified dementia with behavioral disturbance: Secondary | ICD-10-CM | POA: Diagnosis not present

## 2017-08-18 DIAGNOSIS — G6282 Radiation-induced polyneuropathy: Secondary | ICD-10-CM | POA: Diagnosis not present

## 2017-08-18 DIAGNOSIS — I10 Essential (primary) hypertension: Secondary | ICD-10-CM | POA: Diagnosis not present

## 2017-08-18 DIAGNOSIS — F0391 Unspecified dementia with behavioral disturbance: Secondary | ICD-10-CM | POA: Diagnosis not present

## 2017-08-18 DIAGNOSIS — M6281 Muscle weakness (generalized): Secondary | ICD-10-CM | POA: Diagnosis not present

## 2017-08-19 DIAGNOSIS — F0391 Unspecified dementia with behavioral disturbance: Secondary | ICD-10-CM | POA: Diagnosis not present

## 2017-08-25 IMAGING — CT CT ABD-PELV W/ CM
2 of 5 series · 15 of 46 positions shown, 17 images · IV contrast (iopamidol)
Comparison: 01/08/2015 pelvic CT.

CLINICAL DATA: [AGE] hypertensive female with vulvar cancer
1905. Rectal pain for several weeks. Initial encounter.

EXAM:
CT ABDOMEN AND PELVIS WITH CONTRAST
TECHNIQUE: Multidetector CT imaging of the abdomen and pelvis was performed
using the standard protocol following bolus administration of
intravenous contrast.
CONTRAST:  100mL 0S75SP-YLL IOPAMIDOL (0S75SP-YLL) INJECTION 61%

[Series 2: routine abd pel with · axial · 0.64mm/px · z∈[+564,+944]mm · 12 of 86 slices shown, 14 images]
[im 5/86  soft-tissue]
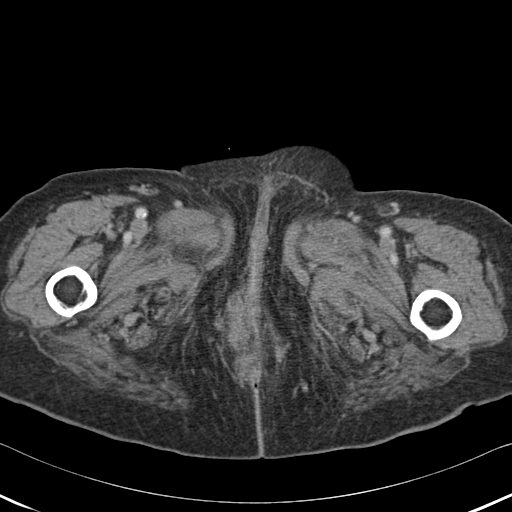
[im 5/86  bone]
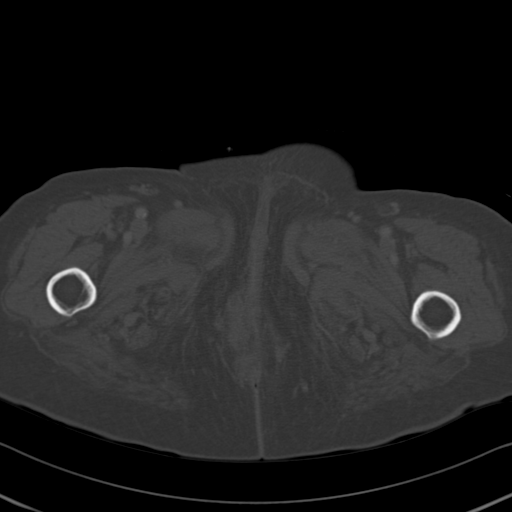
[im 13/86  soft-tissue]
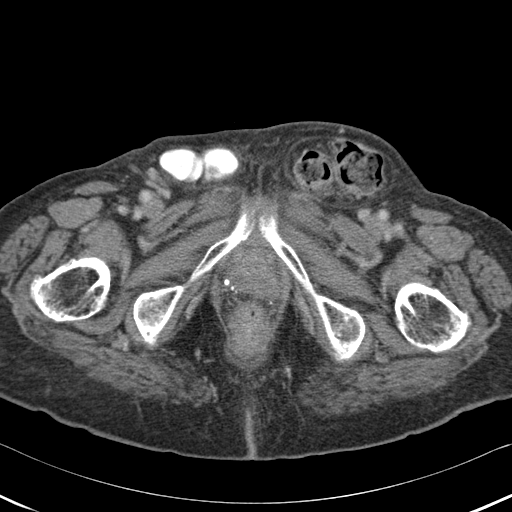
[im 18/86  soft-tissue]
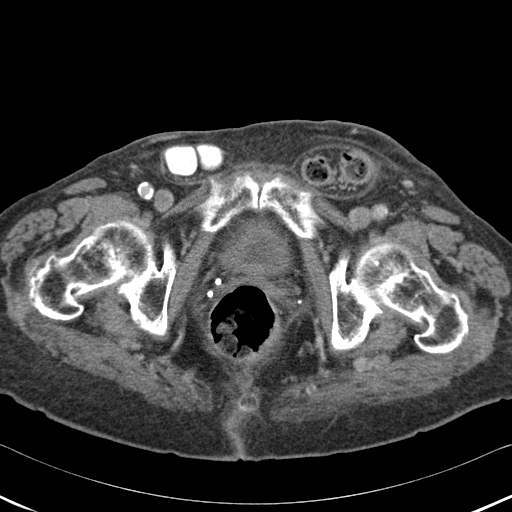
[im 26/86  soft-tissue]
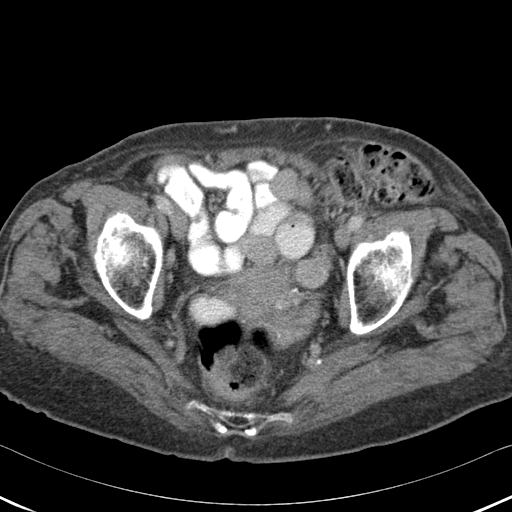
[im 35/86  soft-tissue]
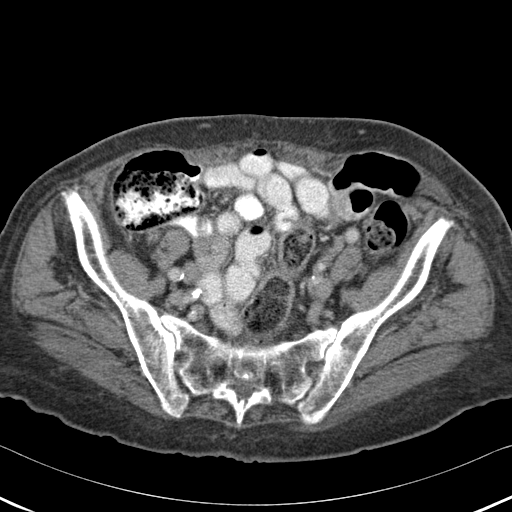
[im 39/86  soft-tissue]
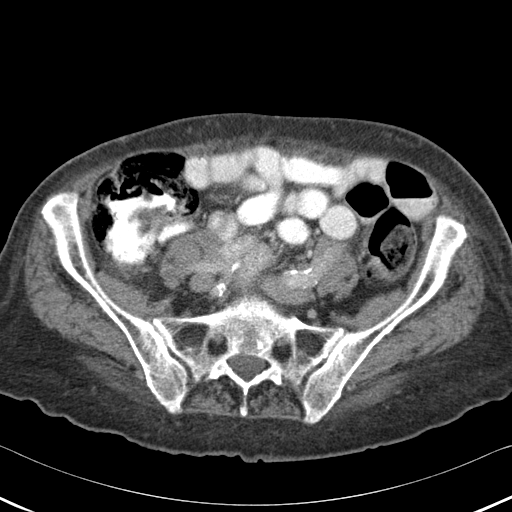
[im 47/86  soft-tissue]
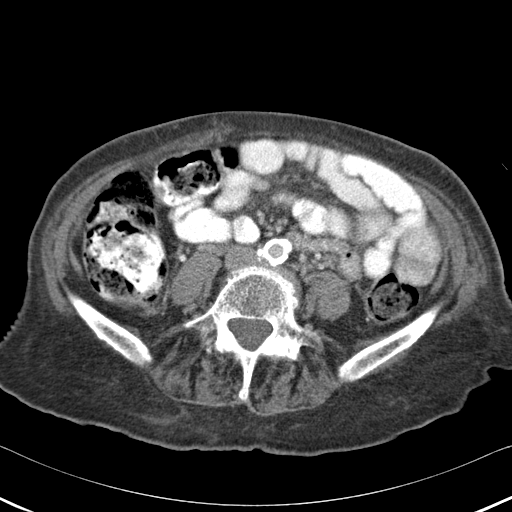
[im 52/86  soft-tissue]
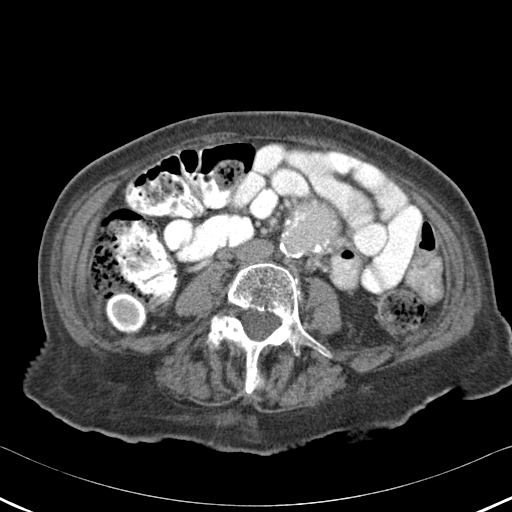
[im 60/86  soft-tissue]
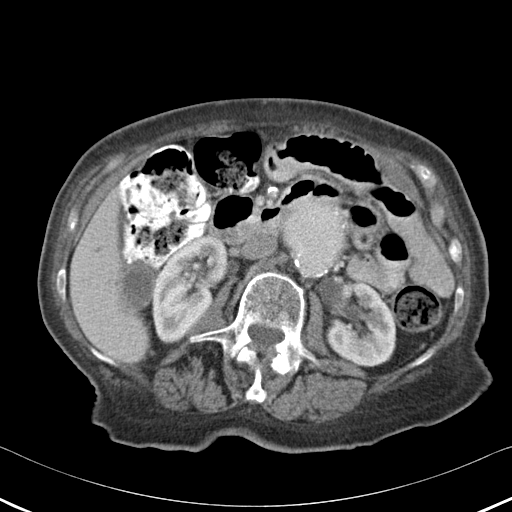
[im 60/86  bone]
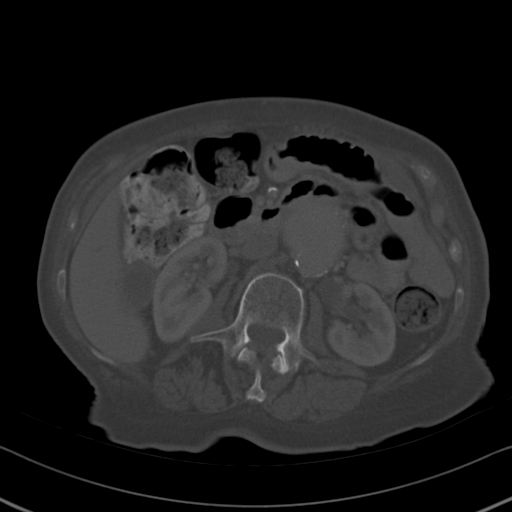
[im 69/86  soft-tissue]
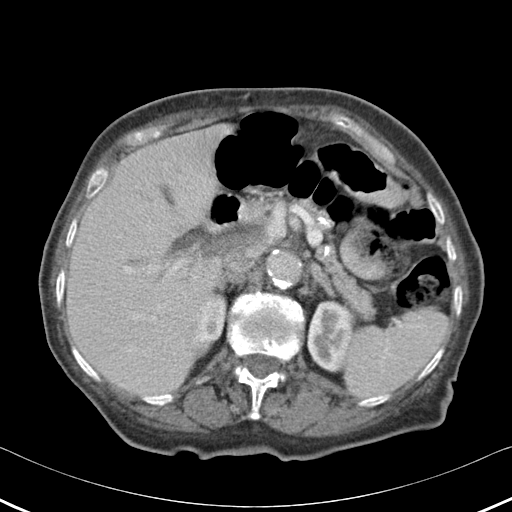
[im 73/86  soft-tissue]
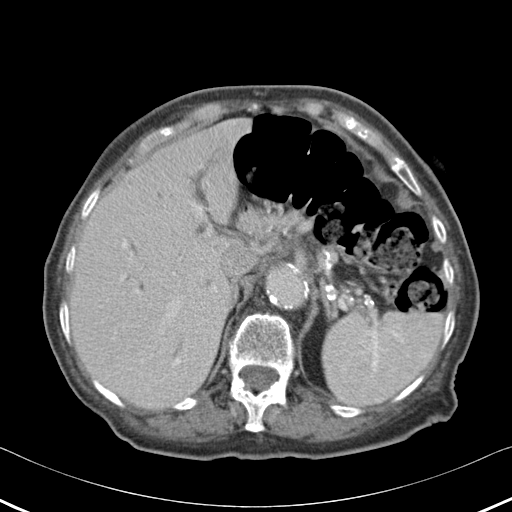
[im 81/86  soft-tissue]
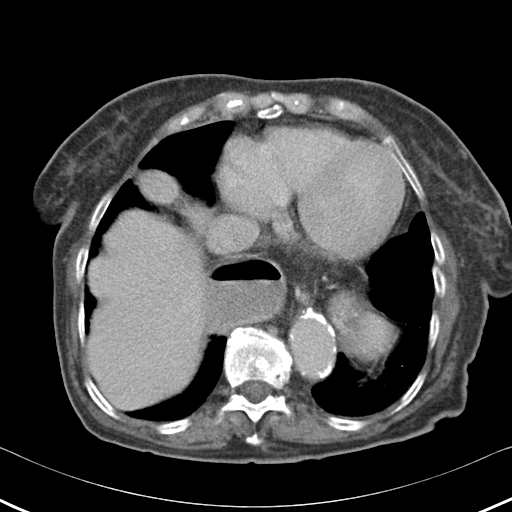

[Series 3: coronal · coronal · 0.69mm/px · 3 of 122 slices shown]
[im 41/122  soft-tissue]
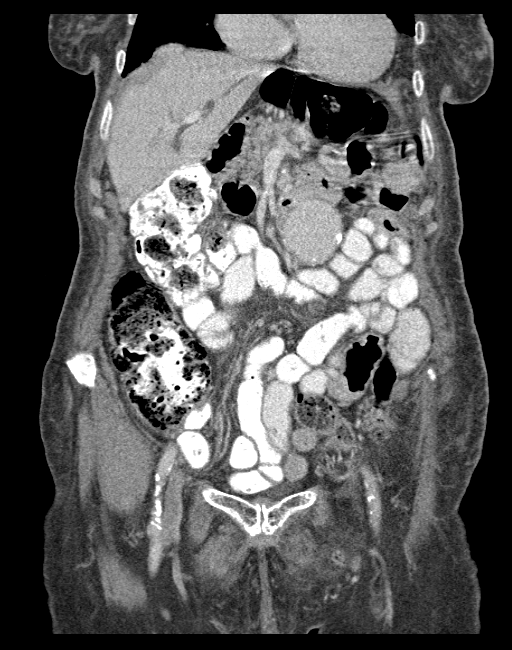
[im 54/122  soft-tissue]
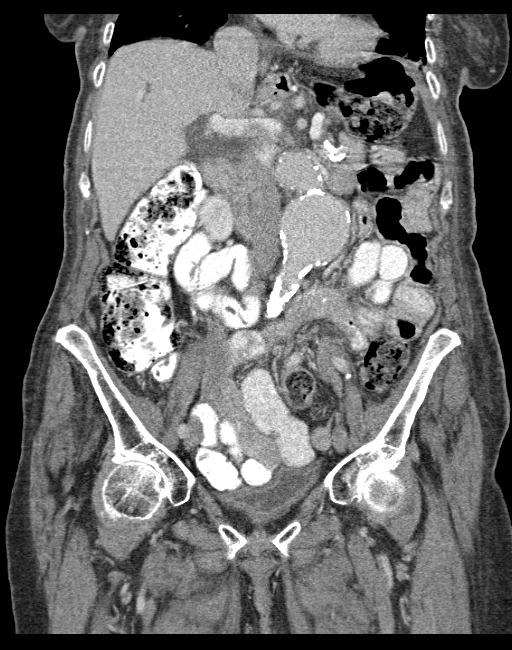
[im 68/122  soft-tissue]
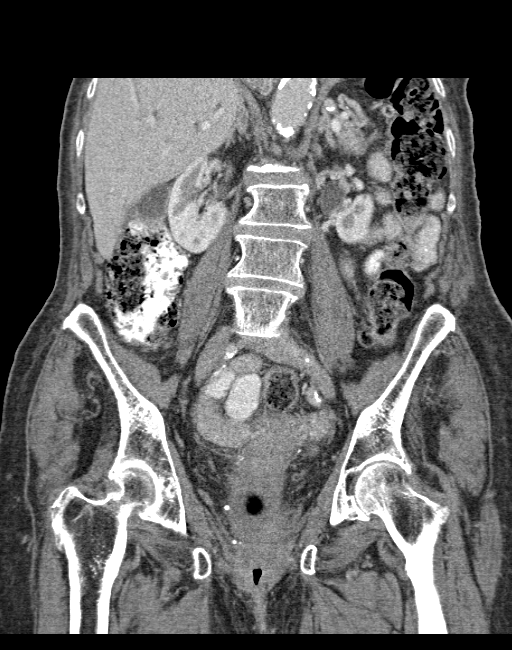

[15 of 46 positions shown; findings below may reference images not displayed]

FINDINGS: Lower chest: Mild atelectatic changes lung base adjacent to large
hiatal hernia.

Hepatobiliary: Mild intrahepatic and extrahepatic biliary duct
prominence may be related to patient's age without obstructing
lesion noted. No worrisome hepatic lesion. Small diaphragmatic
extension of anterior dome right lobe liver. 2.5 cm gallstone.

Pancreas: No mass or inflammation.

Spleen: No mass.

Adrenals/Urinary Tract: No renal obstruction or renal mass. Vascular
calcifications. No adrenal lesion. Contracted noncontrast filled
urinary bladder without gross abnormality.

Stomach/Bowel: Large hiatal hernia. Full extent not imaged. Rotation
stomach with body located superiorly.

Large left inguinal colon containing hernia (sigmoid colon/ascending
colon) without obstruction.

Small bowel containing right inguinal hernia without obstruction.

Vagina and rectum protrude inferiorly. No obvious rectal mass
detected. No surrounding inflammation.

Vascular/Lymphatic: Marked abdominal aortic calcifications with
ectasia an aneurysm measuring up to 5.5 x 5 x 5.2 cm. Only portion
this was imaged on the prior pelvic CT when the aneurysm measured 5
x 5 cm. Narrowing of the femoral arteries and iliac arteries.

No adenopathy.

Reproductive: No worrisome abnormality.

Other: No free intraperitoneal air or abnormal fluid collection.

Musculoskeletal: Scoliosis lumbar spine convex left. Degenerative
changes lower thoracic lumbar spine bilateral region acute
compression fracture or osseous destructive lesion.
IMPRESSION: Vagina and rectum protrude inferiorly. No obvious rectal mass
detected. No surrounding inflammation.

Large hiatal hernia. Full extent not imaged. Rotation of stomach
with body located superiorly.

Large left inguinal colon containing hernia (sigmoid colon/ascending
colon) without obstruction.

Small bowel containing right inguinal hernia without obstruction.

Abdominal aorta aneurysm measuring up to 5.5 x 5 x 5.2 cm. Only
portion of this was imaged on the prior pelvic CT when the aneurysm
measured 5 x 5 cm. Narrowing of the femoral arteries and iliac
arteries.

Mild intrahepatic and extrahepatic biliary duct prominence may be
related to patient's age without obstructing lesion noted.

2.5 cm gallstone.

## 2017-09-07 DIAGNOSIS — F0391 Unspecified dementia with behavioral disturbance: Secondary | ICD-10-CM | POA: Diagnosis not present

## 2017-09-07 DIAGNOSIS — M6281 Muscle weakness (generalized): Secondary | ICD-10-CM | POA: Diagnosis not present

## 2017-09-07 DIAGNOSIS — I10 Essential (primary) hypertension: Secondary | ICD-10-CM | POA: Diagnosis not present

## 2017-09-07 DIAGNOSIS — K649 Unspecified hemorrhoids: Secondary | ICD-10-CM | POA: Diagnosis not present

## 2017-09-11 DIAGNOSIS — M159 Polyosteoarthritis, unspecified: Secondary | ICD-10-CM | POA: Diagnosis not present

## 2017-09-11 DIAGNOSIS — F0391 Unspecified dementia with behavioral disturbance: Secondary | ICD-10-CM | POA: Diagnosis not present

## 2017-09-11 DIAGNOSIS — M6281 Muscle weakness (generalized): Secondary | ICD-10-CM | POA: Diagnosis not present

## 2017-09-11 DIAGNOSIS — G6282 Radiation-induced polyneuropathy: Secondary | ICD-10-CM | POA: Diagnosis not present

## 2017-09-22 DIAGNOSIS — M6281 Muscle weakness (generalized): Secondary | ICD-10-CM | POA: Diagnosis not present

## 2017-09-22 DIAGNOSIS — M159 Polyosteoarthritis, unspecified: Secondary | ICD-10-CM | POA: Diagnosis not present

## 2017-09-22 DIAGNOSIS — F0391 Unspecified dementia with behavioral disturbance: Secondary | ICD-10-CM | POA: Diagnosis not present

## 2017-09-22 DIAGNOSIS — G6282 Radiation-induced polyneuropathy: Secondary | ICD-10-CM | POA: Diagnosis not present

## 2017-10-10 DIAGNOSIS — D649 Anemia, unspecified: Secondary | ICD-10-CM | POA: Diagnosis not present

## 2017-10-10 DIAGNOSIS — Z79899 Other long term (current) drug therapy: Secondary | ICD-10-CM | POA: Diagnosis not present

## 2017-10-13 DIAGNOSIS — M6281 Muscle weakness (generalized): Secondary | ICD-10-CM | POA: Diagnosis not present

## 2017-10-13 DIAGNOSIS — M159 Polyosteoarthritis, unspecified: Secondary | ICD-10-CM | POA: Diagnosis not present

## 2017-10-13 DIAGNOSIS — K6289 Other specified diseases of anus and rectum: Secondary | ICD-10-CM | POA: Diagnosis not present

## 2017-10-13 DIAGNOSIS — G6282 Radiation-induced polyneuropathy: Secondary | ICD-10-CM | POA: Diagnosis not present

## 2017-10-16 DIAGNOSIS — M6281 Muscle weakness (generalized): Secondary | ICD-10-CM | POA: Diagnosis not present

## 2017-10-16 DIAGNOSIS — M159 Polyosteoarthritis, unspecified: Secondary | ICD-10-CM | POA: Diagnosis not present

## 2017-10-16 DIAGNOSIS — G6282 Radiation-induced polyneuropathy: Secondary | ICD-10-CM | POA: Diagnosis not present

## 2017-10-16 DIAGNOSIS — K6289 Other specified diseases of anus and rectum: Secondary | ICD-10-CM | POA: Diagnosis not present

## 2017-10-30 DIAGNOSIS — F0391 Unspecified dementia with behavioral disturbance: Secondary | ICD-10-CM | POA: Diagnosis not present

## 2017-10-30 DIAGNOSIS — F3342 Major depressive disorder, recurrent, in full remission: Secondary | ICD-10-CM | POA: Diagnosis not present

## 2017-11-02 DIAGNOSIS — M6281 Muscle weakness (generalized): Secondary | ICD-10-CM | POA: Diagnosis not present

## 2017-11-02 DIAGNOSIS — F0391 Unspecified dementia with behavioral disturbance: Secondary | ICD-10-CM | POA: Diagnosis not present

## 2017-11-03 DIAGNOSIS — F0391 Unspecified dementia with behavioral disturbance: Secondary | ICD-10-CM | POA: Diagnosis not present

## 2017-11-03 DIAGNOSIS — M6281 Muscle weakness (generalized): Secondary | ICD-10-CM | POA: Diagnosis not present

## 2017-11-04 DIAGNOSIS — M6281 Muscle weakness (generalized): Secondary | ICD-10-CM | POA: Diagnosis not present

## 2017-11-04 DIAGNOSIS — F0391 Unspecified dementia with behavioral disturbance: Secondary | ICD-10-CM | POA: Diagnosis not present

## 2017-11-05 DIAGNOSIS — M6281 Muscle weakness (generalized): Secondary | ICD-10-CM | POA: Diagnosis not present

## 2017-11-05 DIAGNOSIS — F0391 Unspecified dementia with behavioral disturbance: Secondary | ICD-10-CM | POA: Diagnosis not present

## 2017-11-06 DIAGNOSIS — M6281 Muscle weakness (generalized): Secondary | ICD-10-CM | POA: Diagnosis not present

## 2017-11-06 DIAGNOSIS — F0391 Unspecified dementia with behavioral disturbance: Secondary | ICD-10-CM | POA: Diagnosis not present

## 2017-11-09 DIAGNOSIS — F0391 Unspecified dementia with behavioral disturbance: Secondary | ICD-10-CM | POA: Diagnosis not present

## 2017-11-09 DIAGNOSIS — M6281 Muscle weakness (generalized): Secondary | ICD-10-CM | POA: Diagnosis not present

## 2017-11-10 DIAGNOSIS — M6281 Muscle weakness (generalized): Secondary | ICD-10-CM | POA: Diagnosis not present

## 2017-11-10 DIAGNOSIS — F0391 Unspecified dementia with behavioral disturbance: Secondary | ICD-10-CM | POA: Diagnosis not present

## 2017-11-11 DIAGNOSIS — F3342 Major depressive disorder, recurrent, in full remission: Secondary | ICD-10-CM | POA: Diagnosis not present

## 2017-11-11 DIAGNOSIS — Z79899 Other long term (current) drug therapy: Secondary | ICD-10-CM | POA: Diagnosis not present

## 2017-11-11 DIAGNOSIS — F0391 Unspecified dementia with behavioral disturbance: Secondary | ICD-10-CM | POA: Diagnosis not present

## 2017-11-11 DIAGNOSIS — M6281 Muscle weakness (generalized): Secondary | ICD-10-CM | POA: Diagnosis not present

## 2017-11-12 DIAGNOSIS — F0391 Unspecified dementia with behavioral disturbance: Secondary | ICD-10-CM | POA: Diagnosis not present

## 2017-11-12 DIAGNOSIS — M6281 Muscle weakness (generalized): Secondary | ICD-10-CM | POA: Diagnosis not present

## 2017-11-13 DIAGNOSIS — F0391 Unspecified dementia with behavioral disturbance: Secondary | ICD-10-CM | POA: Diagnosis not present

## 2017-11-13 DIAGNOSIS — M6281 Muscle weakness (generalized): Secondary | ICD-10-CM | POA: Diagnosis not present

## 2017-11-15 DIAGNOSIS — F0391 Unspecified dementia with behavioral disturbance: Secondary | ICD-10-CM | POA: Diagnosis not present

## 2017-11-15 DIAGNOSIS — M6281 Muscle weakness (generalized): Secondary | ICD-10-CM | POA: Diagnosis not present

## 2017-11-17 DIAGNOSIS — M6281 Muscle weakness (generalized): Secondary | ICD-10-CM | POA: Diagnosis not present

## 2017-11-17 DIAGNOSIS — F0391 Unspecified dementia with behavioral disturbance: Secondary | ICD-10-CM | POA: Diagnosis not present

## 2017-11-18 DIAGNOSIS — F0391 Unspecified dementia with behavioral disturbance: Secondary | ICD-10-CM | POA: Diagnosis not present

## 2017-11-18 DIAGNOSIS — M6281 Muscle weakness (generalized): Secondary | ICD-10-CM | POA: Diagnosis not present

## 2017-11-19 DIAGNOSIS — F0391 Unspecified dementia with behavioral disturbance: Secondary | ICD-10-CM | POA: Diagnosis not present

## 2017-11-19 DIAGNOSIS — M6281 Muscle weakness (generalized): Secondary | ICD-10-CM | POA: Diagnosis not present

## 2017-11-20 DIAGNOSIS — F0391 Unspecified dementia with behavioral disturbance: Secondary | ICD-10-CM | POA: Diagnosis not present

## 2017-11-20 DIAGNOSIS — M6281 Muscle weakness (generalized): Secondary | ICD-10-CM | POA: Diagnosis not present

## 2017-11-22 DIAGNOSIS — M6281 Muscle weakness (generalized): Secondary | ICD-10-CM | POA: Diagnosis not present

## 2017-11-22 DIAGNOSIS — F0391 Unspecified dementia with behavioral disturbance: Secondary | ICD-10-CM | POA: Diagnosis not present

## 2017-11-23 DIAGNOSIS — M6281 Muscle weakness (generalized): Secondary | ICD-10-CM | POA: Diagnosis not present

## 2017-11-23 DIAGNOSIS — F0391 Unspecified dementia with behavioral disturbance: Secondary | ICD-10-CM | POA: Diagnosis not present

## 2017-12-01 DIAGNOSIS — F0391 Unspecified dementia with behavioral disturbance: Secondary | ICD-10-CM | POA: Diagnosis not present

## 2017-12-01 DIAGNOSIS — K6289 Other specified diseases of anus and rectum: Secondary | ICD-10-CM | POA: Diagnosis not present

## 2017-12-01 DIAGNOSIS — G6282 Radiation-induced polyneuropathy: Secondary | ICD-10-CM | POA: Diagnosis not present

## 2017-12-01 DIAGNOSIS — M159 Polyosteoarthritis, unspecified: Secondary | ICD-10-CM | POA: Diagnosis not present

## 2017-12-15 DIAGNOSIS — D649 Anemia, unspecified: Secondary | ICD-10-CM | POA: Diagnosis not present

## 2017-12-15 DIAGNOSIS — F0391 Unspecified dementia with behavioral disturbance: Secondary | ICD-10-CM | POA: Diagnosis not present

## 2017-12-16 DIAGNOSIS — F0391 Unspecified dementia with behavioral disturbance: Secondary | ICD-10-CM | POA: Diagnosis not present

## 2017-12-17 DIAGNOSIS — F0391 Unspecified dementia with behavioral disturbance: Secondary | ICD-10-CM | POA: Diagnosis not present

## 2017-12-18 DIAGNOSIS — F0391 Unspecified dementia with behavioral disturbance: Secondary | ICD-10-CM | POA: Diagnosis not present

## 2017-12-21 DIAGNOSIS — F0391 Unspecified dementia with behavioral disturbance: Secondary | ICD-10-CM | POA: Diagnosis not present

## 2017-12-22 DIAGNOSIS — F0391 Unspecified dementia with behavioral disturbance: Secondary | ICD-10-CM | POA: Diagnosis not present

## 2017-12-23 DIAGNOSIS — F0391 Unspecified dementia with behavioral disturbance: Secondary | ICD-10-CM | POA: Diagnosis not present

## 2017-12-24 DIAGNOSIS — F0391 Unspecified dementia with behavioral disturbance: Secondary | ICD-10-CM | POA: Diagnosis not present

## 2017-12-25 DIAGNOSIS — F0391 Unspecified dementia with behavioral disturbance: Secondary | ICD-10-CM | POA: Diagnosis not present

## 2017-12-28 DIAGNOSIS — F0391 Unspecified dementia with behavioral disturbance: Secondary | ICD-10-CM | POA: Diagnosis not present

## 2017-12-28 DIAGNOSIS — F3342 Major depressive disorder, recurrent, in full remission: Secondary | ICD-10-CM | POA: Diagnosis not present

## 2017-12-29 DIAGNOSIS — G6282 Radiation-induced polyneuropathy: Secondary | ICD-10-CM | POA: Diagnosis not present

## 2017-12-29 DIAGNOSIS — M159 Polyosteoarthritis, unspecified: Secondary | ICD-10-CM | POA: Diagnosis not present

## 2017-12-29 DIAGNOSIS — K6289 Other specified diseases of anus and rectum: Secondary | ICD-10-CM | POA: Diagnosis not present

## 2017-12-29 DIAGNOSIS — K5900 Constipation, unspecified: Secondary | ICD-10-CM | POA: Diagnosis not present

## 2017-12-29 DIAGNOSIS — Z79899 Other long term (current) drug therapy: Secondary | ICD-10-CM | POA: Diagnosis not present

## 2017-12-29 DIAGNOSIS — F0391 Unspecified dementia with behavioral disturbance: Secondary | ICD-10-CM | POA: Diagnosis not present

## 2017-12-30 DIAGNOSIS — F0391 Unspecified dementia with behavioral disturbance: Secondary | ICD-10-CM | POA: Diagnosis not present

## 2017-12-31 DIAGNOSIS — F0391 Unspecified dementia with behavioral disturbance: Secondary | ICD-10-CM | POA: Diagnosis not present

## 2018-01-01 DIAGNOSIS — F0391 Unspecified dementia with behavioral disturbance: Secondary | ICD-10-CM | POA: Diagnosis not present

## 2018-01-04 DIAGNOSIS — F0391 Unspecified dementia with behavioral disturbance: Secondary | ICD-10-CM | POA: Diagnosis not present

## 2018-01-05 DIAGNOSIS — F0391 Unspecified dementia with behavioral disturbance: Secondary | ICD-10-CM | POA: Diagnosis not present

## 2018-01-06 DIAGNOSIS — F0391 Unspecified dementia with behavioral disturbance: Secondary | ICD-10-CM | POA: Diagnosis not present

## 2018-01-07 DIAGNOSIS — F0391 Unspecified dementia with behavioral disturbance: Secondary | ICD-10-CM | POA: Diagnosis not present

## 2018-01-08 DIAGNOSIS — F0391 Unspecified dementia with behavioral disturbance: Secondary | ICD-10-CM | POA: Diagnosis not present

## 2018-01-11 DIAGNOSIS — F0391 Unspecified dementia with behavioral disturbance: Secondary | ICD-10-CM | POA: Diagnosis not present

## 2018-01-12 DIAGNOSIS — R634 Abnormal weight loss: Secondary | ICD-10-CM | POA: Diagnosis not present

## 2018-01-12 DIAGNOSIS — M159 Polyosteoarthritis, unspecified: Secondary | ICD-10-CM | POA: Diagnosis not present

## 2018-01-12 DIAGNOSIS — G6282 Radiation-induced polyneuropathy: Secondary | ICD-10-CM | POA: Diagnosis not present

## 2018-01-12 DIAGNOSIS — F0391 Unspecified dementia with behavioral disturbance: Secondary | ICD-10-CM | POA: Diagnosis not present

## 2018-01-12 DIAGNOSIS — F419 Anxiety disorder, unspecified: Secondary | ICD-10-CM | POA: Diagnosis not present

## 2018-01-13 DIAGNOSIS — F0391 Unspecified dementia with behavioral disturbance: Secondary | ICD-10-CM | POA: Diagnosis not present

## 2018-01-14 DIAGNOSIS — F0391 Unspecified dementia with behavioral disturbance: Secondary | ICD-10-CM | POA: Diagnosis not present

## 2018-01-15 DIAGNOSIS — F0391 Unspecified dementia with behavioral disturbance: Secondary | ICD-10-CM | POA: Diagnosis not present

## 2018-02-24 DIAGNOSIS — G6282 Radiation-induced polyneuropathy: Secondary | ICD-10-CM | POA: Diagnosis not present

## 2018-02-24 DIAGNOSIS — I10 Essential (primary) hypertension: Secondary | ICD-10-CM | POA: Diagnosis not present

## 2018-02-24 DIAGNOSIS — F419 Anxiety disorder, unspecified: Secondary | ICD-10-CM | POA: Diagnosis not present

## 2018-02-24 DIAGNOSIS — M159 Polyosteoarthritis, unspecified: Secondary | ICD-10-CM | POA: Diagnosis not present

## 2018-04-05 DIAGNOSIS — D649 Anemia, unspecified: Secondary | ICD-10-CM | POA: Diagnosis not present

## 2018-04-12 DIAGNOSIS — D649 Anemia, unspecified: Secondary | ICD-10-CM | POA: Diagnosis not present

## 2018-05-03 DIAGNOSIS — D649 Anemia, unspecified: Secondary | ICD-10-CM | POA: Diagnosis not present

## 2018-05-03 DIAGNOSIS — Z79899 Other long term (current) drug therapy: Secondary | ICD-10-CM | POA: Diagnosis not present

## 2018-08-05 ENCOUNTER — Encounter (HOSPITAL_COMMUNITY): Payer: Self-pay

## 2018-08-05 ENCOUNTER — Telehealth: Payer: Self-pay | Admitting: Gynecologic Oncology

## 2018-08-05 ENCOUNTER — Emergency Department (HOSPITAL_COMMUNITY)
Admission: EM | Admit: 2018-08-05 | Discharge: 2018-08-05 | Disposition: A | Discharge status: Home / Self Care | Payer: Medicare Other | Attending: Emergency Medicine | Admitting: Emergency Medicine

## 2018-08-05 ENCOUNTER — Other Ambulatory Visit: Payer: Self-pay

## 2018-08-05 DIAGNOSIS — C519 Malignant neoplasm of vulva, unspecified: Secondary | ICD-10-CM | POA: Diagnosis not present

## 2018-08-05 DIAGNOSIS — N9089 Other specified noninflammatory disorders of vulva and perineum: Secondary | ICD-10-CM | POA: Diagnosis present

## 2018-08-05 DIAGNOSIS — Z7982 Long term (current) use of aspirin: Secondary | ICD-10-CM | POA: Insufficient documentation

## 2018-08-05 DIAGNOSIS — F0390 Unspecified dementia without behavioral disturbance: Secondary | ICD-10-CM | POA: Diagnosis not present

## 2018-08-05 DIAGNOSIS — R222 Localized swelling, mass and lump, trunk: Secondary | ICD-10-CM | POA: Diagnosis not present

## 2018-08-05 DIAGNOSIS — I10 Essential (primary) hypertension: Secondary | ICD-10-CM | POA: Insufficient documentation

## 2018-08-05 DIAGNOSIS — Z79899 Other long term (current) drug therapy: Secondary | ICD-10-CM | POA: Diagnosis not present

## 2018-08-05 DIAGNOSIS — R1909 Other intra-abdominal and pelvic swelling, mass and lump: Secondary | ICD-10-CM

## 2018-08-05 DIAGNOSIS — F17210 Nicotine dependence, cigarettes, uncomplicated: Secondary | ICD-10-CM | POA: Insufficient documentation

## 2018-08-05 HISTORY — DX: Anxiety disorder, unspecified: F41.9

## 2018-08-05 HISTORY — DX: Cognitive communication deficit: R41.841

## 2018-08-05 HISTORY — DX: Major depressive disorder, single episode, unspecified: F32.9

## 2018-08-05 HISTORY — DX: Dysphagia, unspecified: R13.10

## 2018-08-05 HISTORY — DX: Anemia, unspecified: D64.9

## 2018-08-05 HISTORY — DX: Chronic pain syndrome: G89.4

## 2018-08-05 LAB — BASIC METABOLIC PANEL
Anion gap: 6 (ref 5–15)
BUN: 20 mg/dL (ref 8–23)
CO2: 26 mmol/L (ref 22–32)
CREATININE: 0.73 mg/dL (ref 0.44–1.00)
Calcium: 8.8 mg/dL — ABNORMAL LOW (ref 8.9–10.3)
Chloride: 105 mmol/L (ref 98–111)
GFR calc Af Amer: >60 mL/min (ref >60)
GFR calc non Af Amer: >60 mL/min (ref >60)
Glucose, Bld: 131 mg/dL — ABNORMAL HIGH (ref 70–99)
POTASSIUM: 4.2 mmol/L (ref 3.5–5.1)
Sodium: 137 mmol/L (ref 135–145)

## 2018-08-05 LAB — CBC WITH DIFFERENTIAL/PLATELET
Abs Immature Granulocytes: 0.02 10*3/uL (ref 0.00–0.07)
Basophils Absolute: 0.1 10*3/uL (ref 0.0–0.1)
Basophils Relative: 1 %
Eosinophils Absolute: 0.1 10*3/uL (ref 0.0–0.5)
Eosinophils Relative: 2 %
HCT: 35.3 % — ABNORMAL LOW (ref 36.0–46.0)
Hemoglobin: 10.3 g/dL — ABNORMAL LOW (ref 12.0–15.0)
Immature Granulocytes: 0 %
LYMPHS PCT: 10 %
Lymphs Abs: 0.8 10*3/uL (ref 0.7–4.0)
MCH: 27.3 pg (ref 26.0–34.0)
MCHC: 29.2 g/dL — ABNORMAL LOW (ref 30.0–36.0)
MCV: 93.6 fL (ref 80.0–100.0)
Monocytes Absolute: 0.6 10*3/uL (ref 0.1–1.0)
Monocytes Relative: 8 %
Neutro Abs: 6 10*3/uL (ref 1.7–7.7)
Neutrophils Relative %: 79 %
Platelets: 215 10*3/uL (ref 150–400)
RBC: 3.77 MIL/uL — AB (ref 3.87–5.11)
RDW: 13.5 % (ref 11.5–15.5)
WBC: 7.6 10*3/uL (ref 4.0–10.5)
nRBC: 0 % (ref 0.0–0.2)

## 2018-08-05 MED ORDER — OXYCODONE-ACETAMINOPHEN 5-325 MG PO TABS
1.0000 | ORAL_TABLET | Freq: Once | ORAL | Status: AC
  Administered 2018-08-05: 1 via ORAL
  Filled 2018-08-05: qty 1

## 2018-08-05 MED ORDER — LIDOCAINE-EPINEPHRINE (PF) 2 %-1:200000 IJ SOLN
20.0000 mL | Freq: Once | INTRAMUSCULAR | Status: DC
  Filled 2018-08-05: qty 20

## 2018-08-05 MED ORDER — SODIUM CHLORIDE 0.9 % IV BOLUS: 500.0000 mL | Freq: Once | INTRAVENOUS | Status: DC

## 2018-08-05 NOTE — ED Notes (Signed)
Called [facility] for transport

## 2018-08-05 NOTE — ED Triage Notes (Signed)
Pt brought from [place]. Per EMS staff reports that pt has skin cancer in her groin area and was scratching the area and causing a small amount of bleeding. Staff wanted pt evaluated. No bleeding at present . Mass noted to right groin

## 2018-08-05 NOTE — ED Notes (Signed)
Placed dressing over right inguinal area. No bleeding noted at this time.

## 2018-08-05 NOTE — Discharge Instructions (Addendum)
This is most likely a recurrence of your vulvar cancer.  Follow-up with Dr. Denman George.  Phone number given.  Suggest hospice consult.  If bleeding returns, apply pressure and/or pressure dressing.

## 2018-08-05 NOTE — ED Notes (Signed)
Attempted to call report to [facility], no answer, unable to leave vm, rang until busy

## 2018-08-05 NOTE — ED Notes (Signed)
Report to [place] with [facility]

## 2018-08-05 NOTE — ED Notes (Signed)
Gave patient peanut butter, crackers, and orange juice as requested and approved by Dr Lacinda Axon.

## 2018-08-05 NOTE — Telephone Encounter (Signed)
Called back to [facility] ED.  Dr. Lacinda Axon has left for the day.  Informed staff member that I spoke with Dr. Sondra Come who feels there may be a role for additional radiation but the question is would she be able to lay still on the table for the radiation due to her progressive dementia.  [facility] ED states the patient is up for discharge.  Our office will continue efforts to reach the family.  Per Dr. Denman George, if unable to tolerate additional palliative radiation, then recommendation would be for Hospice.

## 2018-08-06 ENCOUNTER — Telehealth: Payer: Self-pay

## 2018-08-06 NOTE — ED Provider Notes (Signed)
[facility] EMERGENCY DEPARTMENT Provider Note   CSN: [id number] Arrival date & time: 08/05/18  6213     History   Chief Complaint Chief Complaint  Patient presents with  . Coagulation Disorder    HPI Erin Mckay is a 84 y.o. female.  Level 5 caveat for dementia.  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 Denman George).  No further treatment was obtained at that time.  She now has a large lesion on her right vulvar area and it was bleeding at the nursing home.  She is coming here to have that treated.  No other issues at this time.     Past Medical History:  Diagnosis Date  . Anemia   . Anxiety   . Cataract    03-20-14 right eye" afraid to have surgery"  . Chronic pain syndrome   . Cognitive communication deficit   . 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 Dementia ([place])   . Dysphagia   . Frequent UTI   . Hard of hearing   . Hemorrhoids    remains a problem.  . Hypertension   . MDD (major depressive disorder)   . Measles   . Vision loss, bilateral   . Vulvar cancer ([place]) 02/09/14  . Vulvar cancer ([place]) 03/28/14    Patient Active Problem List   Diagnosis Date Noted  . 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 Dementia with behavioral disturbance ([place]) 03/31/2017  . 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 Rectal pain   . Internal prolapsed hemorrhoids 05/23/2014  . Hypertension 02/27/2014  . Vulvar cancer ([place]) 02/09/2014    Past Surgical History:  Procedure Laterality Date  . APPENDECTOMY    . CATARACT EXTRACTION Left    "blinded" s/p cataract surgery, cataract on left -"afraid to have surgery"  . EYE SURGERY Left    cataract removal and was blinded.  Marland Kitchen FLEXIBLE SIGMOIDOSCOPY N/A 08/08/2014   Dr. Oneida Alar: moderate sized hemorrhoids Grade 2-3 with banding X 3. Mild diverticulosis in sigmoid colon  . FLEXIBLE SIGMOIDOSCOPY N/A 08/22/2014   3 small ulcers present where prior bands placed   .  HEMORRHOID BANDING N/A 08/08/2014   Procedure: HEMORRHOID BANDING;  Surgeon: Danie Binder, MD;  Location: [facility] SUITE;  Service: Endoscopy;  Laterality: N/A;  . TUBAL LIGATION    . VULVECTOMY Right 03/28/2014   Procedure: WIDE LOCAL EXCISION OF RIGHT VULVAR;  Surgeon: Everitt Amber, MD;  Location: [facility] ORS;  Service: Gynecology;  Laterality: Right;     OB History    Gravida  5   Para  5   Term  5   Preterm      AB      Living  4     SAB      TAB      Ectopic      Multiple      Live Births               Home Medications    Prior to Admission medications   Medication Sig Start Date End Date Taking? Authorizing Provider  acetaminophen (TYLENOL) 325 MG tablet Take 650 mg by mouth 2 (two) times daily.   Yes [provider]  amLODipine (NORVASC) 10 MG tablet Take 5 mg by mouth daily.    Yes [provider]  bisacodyl (DULCOLAX) 5 MG EC tablet Take 5 mg by mouth daily as needed for moderate constipation.  Yes [provider]  cholecalciferol (VITAMIN D3) 25 MCG (1000 UT) tablet Take 2,000 Units by mouth daily.   Yes [provider]  DULoxetine (CYMBALTA) 30 MG capsule Take 30 mg by mouth daily.   Yes [provider]  gabapentin (NEURONTIN) 100 MG capsule Take 100 mg by mouth at bedtime.   Yes [provider]  Lido-PE-Glycerin-Petrolatum (PREPARATION H RAPID RELIEF) 5-0.25-14.4-15 % CREA Place 1 application rectally 3 (three) times daily.   Yes [provider]  oxycodone (OXY-IR) 5 MG capsule Take 5 mg by mouth every 6 (six) hours as needed.    Yes [provider]  senna-docusate (SENOKOT-S) 8.6-50 MG tablet Take 2 tablets by mouth 2 (two) times daily.   Yes [provider]  acetaminophen (TYLENOL) 500 MG tablet Take 1,000 mg by mouth as needed.    [provider]  acetaminophen-codeine (TYLENOL #3) 300-30 MG tablet Take 1-2 tablets by mouth every 4 (four) hours as needed for moderate  pain. Patient not taking: Reported on 08/05/2018 01/06/17   Claretta Fraise, MD  aspirin EC 81 MG tablet Take 81 mg by mouth daily.    [provider]  PROCTO-MED HC 2.5 % rectal cream APPLY 1 APPLICATION RECTALLY TWICE DAILY Patient not taking: Reported on 08/05/2018 03/23/17   Claretta Fraise, MD  QUEtiapine (SEROQUEL) 100 MG tablet Take 1 tablet (100 mg total) by mouth at bedtime. Patient not taking: Reported on 08/05/2018 04/14/17   Claretta Fraise, MD    Family History Family History  Problem Relation Age of Onset  . Heart disease Mother   . Early death Father   . Anuerysm Sister        stomach  . Cancer Brother   . Hypertension Daughter   . Hyperlipidemia Daughter   . Cancer Son        bladder  . Heart disease Brother   . Hypertension Son   . Hypertension Son   . Hypertension Daughter   . Colon cancer Neg Hx     Social History Social History   Tobacco Use  . Smoking status: Current Every Day Smoker    Packs/day: 1.50    Years: 48.00    Pack years: 72.00    Types: Cigarettes  . Smokeless tobacco: Former Systems developer    Types: Chew  Substance Use Topics  . Alcohol use: No    Alcohol/week: 0.0 standard drinks  . Drug use: No     Allergies   Pineapple; Streptomycin; and Macrolides and ketolides   Review of Systems Review of Systems  Unable to perform ROS: Dementia     Physical Exam Updated Vital Signs BP 127/79 (BP Location: Right Arm)   Pulse 67   Temp 98 F (36.7 C) (Oral)   Resp 18   SpO2 98%   Physical Exam Vitals signs and nursing note reviewed.  Constitutional:      Appearance: She is well-developed.     Comments: demented  HENT:     Head: Normocephalic and atraumatic.  Eyes:     Conjunctiva/sclera: Conjunctivae normal.  Neck:     Musculoskeletal: Neck supple.  Cardiovascular:     Rate and Rhythm: Normal rate and regular rhythm.  Pulmonary:     Effort: Pulmonary effort is normal.     Breath sounds: Normal breath sounds.  Abdominal:      General: Bowel sounds are normal.     Palpations: Abdomen is soft.  Musculoskeletal: Normal range of motion.  Skin:  General: Skin is warm and dry.  Neurological:     Comments: Moving all extremities  Psychiatric:     Comments: Demented   Skin: 7 x 3 x 3 cm fungating friable mass in the right vulvar area.  Good hemostasis at this time.   ED Treatments / Results  Labs (all labs ordered are listed, but only abnormal results are displayed) Labs Reviewed  CBC WITH DIFFERENTIAL/PLATELET - Abnormal; Notable for the following components:      Result Value   RBC 3.77 (*)    Hemoglobin 10.3 (*)    HCT 35.3 (*)    MCHC 29.2 (*)    All other components within normal limits  BASIC METABOLIC PANEL - Abnormal; Notable for the following components:   Glucose, Bld 131 (*)    Calcium 8.8 (*)    All other components within normal limits    EKG None  Radiology No results found.  Procedures Procedures (including critical care time)  Medications Ordered in ED Medications  oxyCODONE-acetaminophen (PERCOCET/ROXICET) 5-325 MG per tablet 1 tablet (1 tablet Oral Given 08/05/18 1634)     Initial Impression / Assessment and Plan / ED Course  I have reviewed the triage vital signs and the nursing notes.  Pertinent labs & imaging results that were available during my care of the patient were reviewed by me and considered in my medical decision making (see chart for details).     Patient has a reoccurrence of her vulvar cancer.  I was able to discuss the case with Dr. Denman George (GYN surgical oncologist).  She will follow-up with her in the office.  Bleeding of the mass is well controlled at this time.  Discussed with 2 daughters.  They understand the palliative nature of this problem.  Final Clinical Impressions(s) / ED Diagnoses   Final diagnoses:  Mass of right inguinal region  Vulvar cancer [facility])    ED Discharge Orders    None       Nat Christen, MD 08/06/18 [phone]

## 2018-08-06 NOTE — Telephone Encounter (Signed)
LM for daughter Erin Mckay to contact the [facility] to schedule an appointment for her mother to see Dr. Denman George on 1-6 or 08-25-18 to discuss plan of care moving forward for her mother. Pt in Nursing facility and transportation will need to be arranged as well as obtaining name of facility if appointment arranged for visit with Dr. Denman George. Pt will need a 30 minute established appointment for discussion of care.

## 2018-08-10 ENCOUNTER — Telehealth: Payer: Self-pay | Admitting: Oncology

## 2018-08-10 NOTE — Telephone Encounter (Signed)
Meredith Mody, patient's daughter, and discussed making an appointment to see Dr. Denman George to discuss plan of care going forward.  Velva Harman said that she doesn't think anything will help and does not want to put her through treatment that would cause her more pain. Discussed that she may be able to receive palliative radiation to help control the bleeding although she would need to lay still for the treatment.  Velva Harman said that it is difficult for her mother to travel to [place] because she has bleeding when she moves around.  She is going to discuss the options with the doctor at the nursing home and with her family and will call us back after Christmas.

## 2018-08-16 ENCOUNTER — Telehealth: Payer: Self-pay

## 2018-08-16 NOTE — Telephone Encounter (Signed)
Erin Mckay from nursing home said she is scheduler there and patient was seen in hospital and is to make appt for f/u ASAP, with Dr Denman George.  Spoke with Elmo Putt RN navigator- made appt for Dr Denman George for Jan 6th 2 pm. No other needs per her at this time.

## 2018-08-23 ENCOUNTER — Ambulatory Visit: Payer: Medicare Other | Admitting: Gynecologic Oncology

## 2018-08-23 ENCOUNTER — Telehealth: Payer: Self-pay | Admitting: *Deleted

## 2018-08-23 NOTE — Telephone Encounter (Signed)
Attempted to contact Erin Mckay at [facility] room, unable to leave a message. Patient missed the 1/6 appt

## 2018-08-24 ENCOUNTER — Telehealth: Payer: Self-pay | Admitting: *Deleted

## 2018-08-24 NOTE — Telephone Encounter (Signed)
Called and spoke with Arbie Cookey the scheduler at [facility] center. Per Arbie Cookey the family and patient would like to cancel the appt from yesterday and not reschedule. [facility] APP notified

## 2018-11-17 DEATH — deceased
